# Patient Record
Sex: Female | Born: 1980 | Race: White | Hispanic: No | Marital: Married | Smoking: Never smoker
Health system: Southern US, Community
[De-identification: ages and names within clinical notes are randomized; demographics above are authoritative.]

## PROBLEM LIST (undated history)

## (undated) DIAGNOSIS — E039 Hypothyroidism, unspecified: Secondary | ICD-10-CM

## (undated) DIAGNOSIS — A609 Anogenital herpesviral infection, unspecified: Secondary | ICD-10-CM

## (undated) DIAGNOSIS — Z8619 Personal history of other infectious and parasitic diseases: Secondary | ICD-10-CM

## (undated) HISTORY — DX: Anogenital herpesviral infection, unspecified: A60.9

## (undated) HISTORY — DX: Hypothyroidism, unspecified: E03.9

## (undated) HISTORY — DX: Personal history of other infectious and parasitic diseases: Z86.19

---

## 2002-09-29 ENCOUNTER — Other Ambulatory Visit: Admission: RE | Admit: 2002-09-29 | Discharge: 2002-09-29 | Payer: Self-pay | Admitting: Obstetrics and Gynecology

## 2004-02-05 ENCOUNTER — Other Ambulatory Visit: Admission: RE | Admit: 2004-02-05 | Discharge: 2004-02-05 | Payer: Self-pay | Admitting: Obstetrics and Gynecology

## 2005-04-29 ENCOUNTER — Other Ambulatory Visit: Admission: RE | Admit: 2005-04-29 | Discharge: 2005-04-29 | Payer: Self-pay | Admitting: Obstetrics & Gynecology

## 2007-03-04 HISTORY — PX: CHOLECYSTECTOMY: SHX55

## 2007-03-24 ENCOUNTER — Emergency Department (HOSPITAL_COMMUNITY): Admission: EM | Admit: 2007-03-24 | Discharge: 2007-03-24 | Payer: Self-pay | Admitting: Emergency Medicine

## 2007-04-30 ENCOUNTER — Encounter (INDEPENDENT_AMBULATORY_CARE_PROVIDER_SITE_OTHER): Payer: Self-pay | Admitting: Surgery

## 2007-04-30 ENCOUNTER — Ambulatory Visit (HOSPITAL_COMMUNITY): Admission: RE | Admit: 2007-04-30 | Discharge: 2007-04-30 | Payer: Self-pay | Admitting: Surgery

## 2008-12-04 IMAGING — US US ABDOMEN COMPLETE
1 series · 13 of 25 positions shown · non-contrast
Comparison: None.

CLINICAL DATA: 26 year-old with abdominal pain.
 ABDOMEN ULTRASOUND:
TECHNIQUE: Complete abdominal ultrasound examination was performed including evaluation of the liver, gallbladder, bile ducts, pancreas, kidneys, spleen, IVC, and abdominal aorta.

[Series 1: unknown · 0.33mm/px · 13 of 50 slices shown]
[im 1/50]
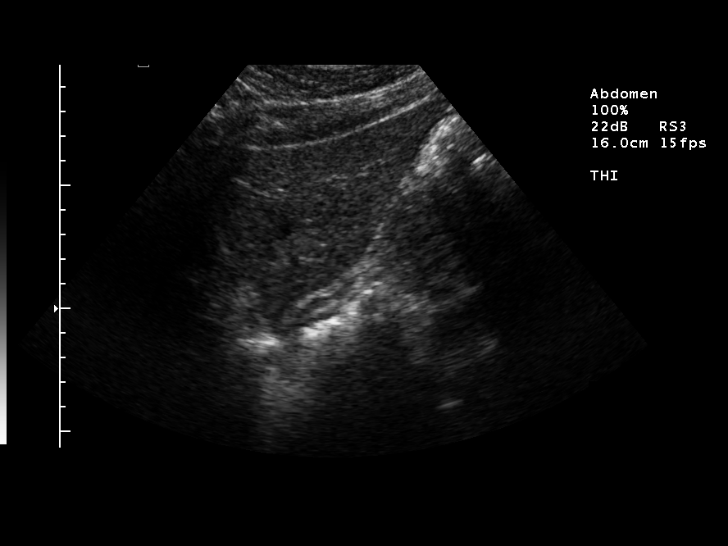
[im 5/50]
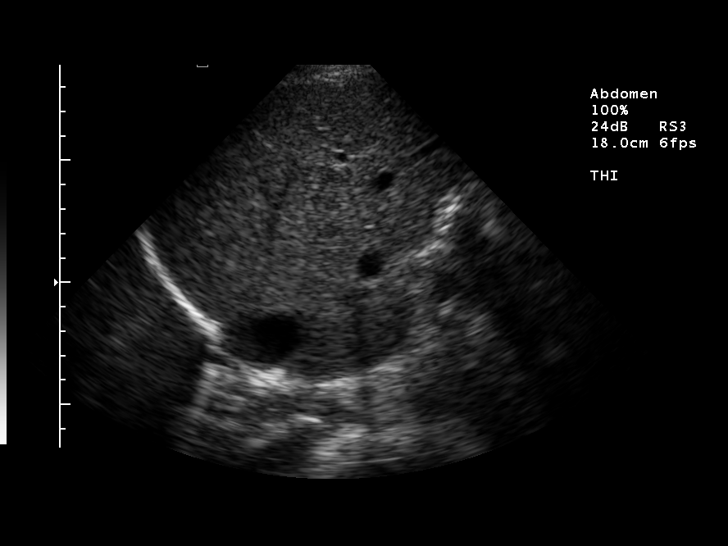
[im 9/50]
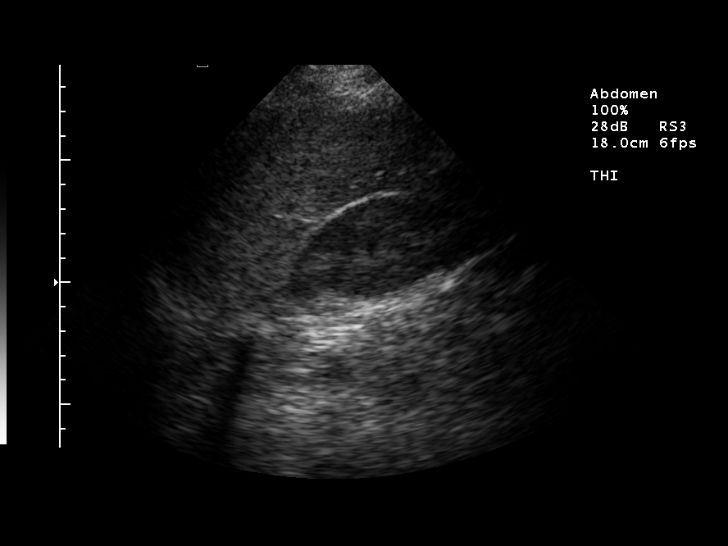
[im 13/50]
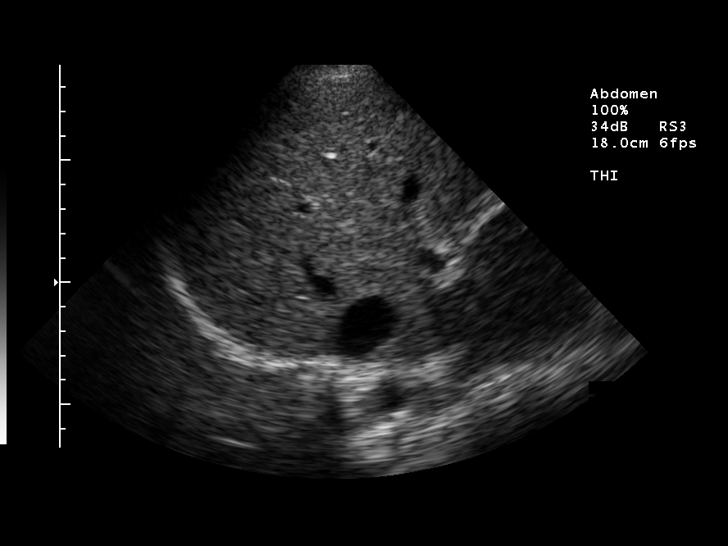
[im 17/50]
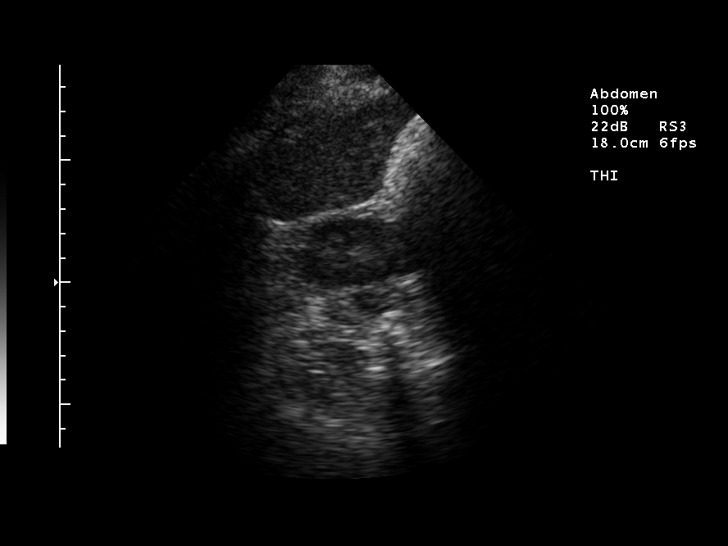
[im 21/50]
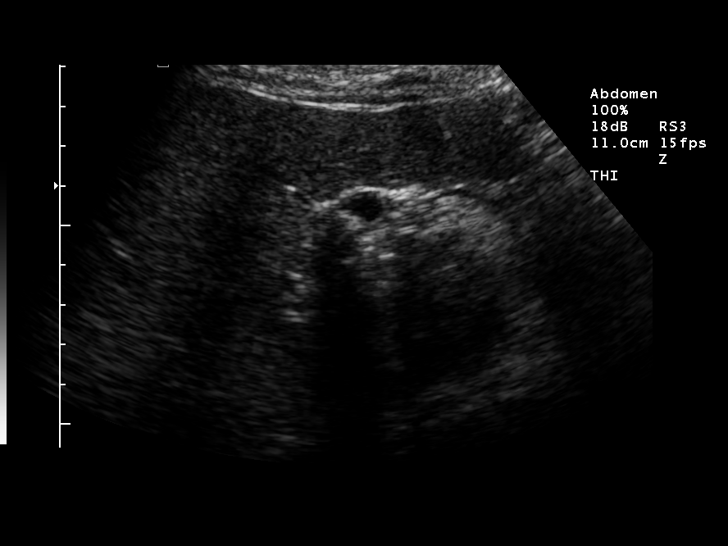
[im 25/50]
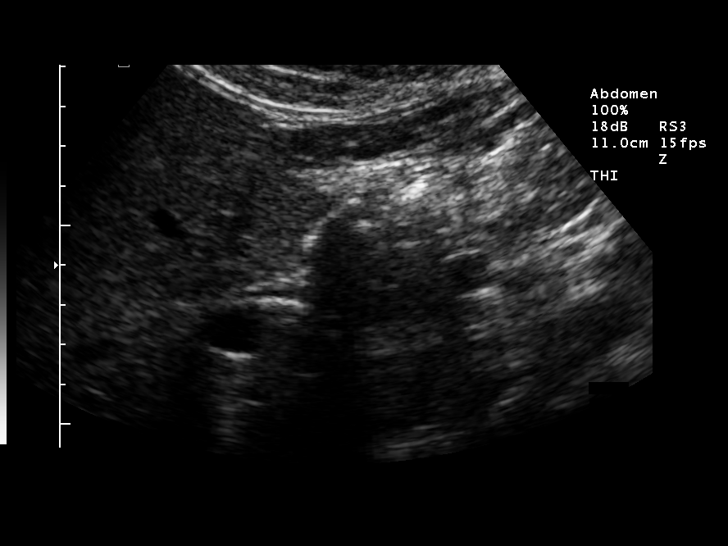
[im 29/50]
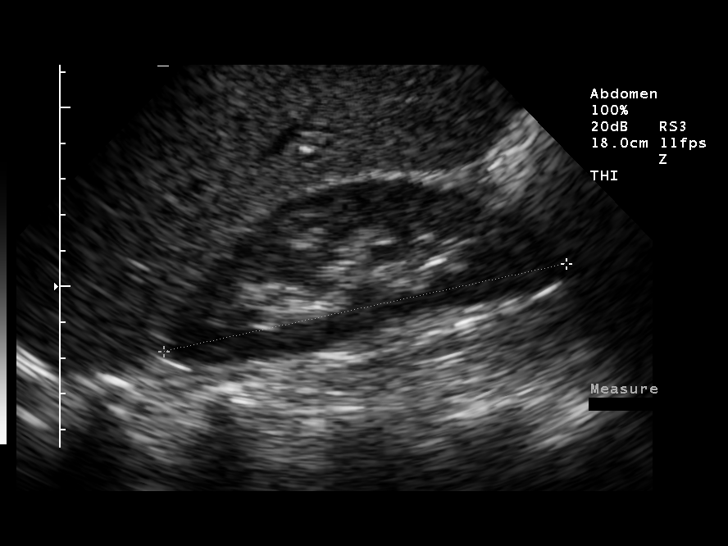
[im 33/50]
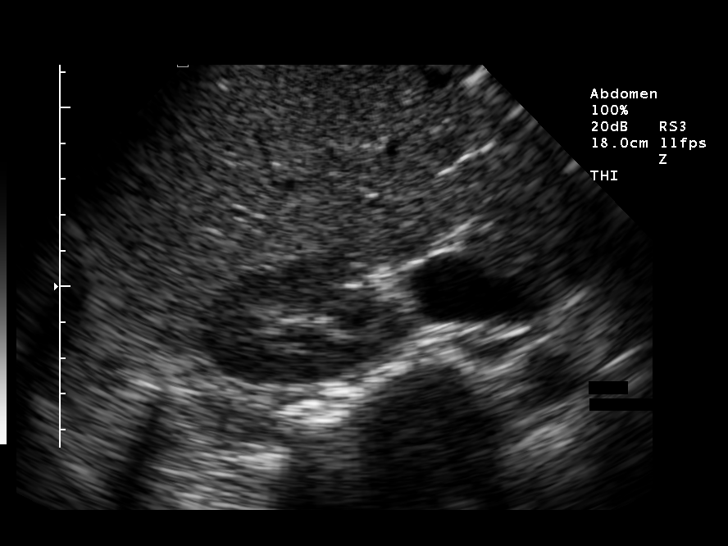
[im 37/50]
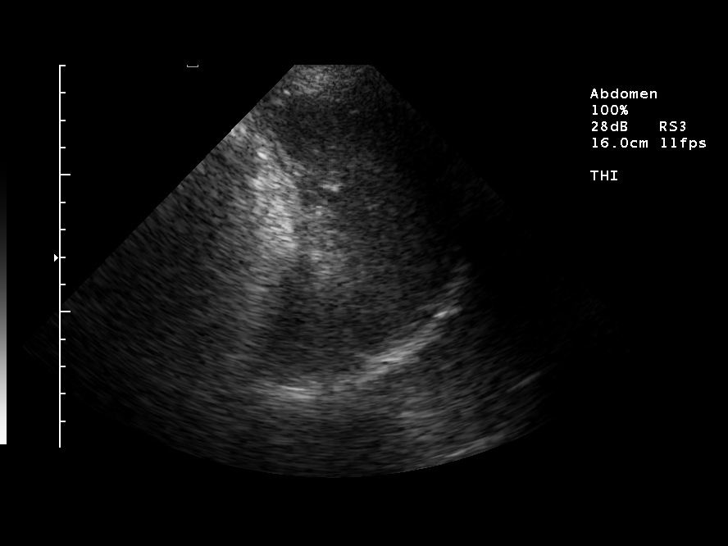
[im 41/50]
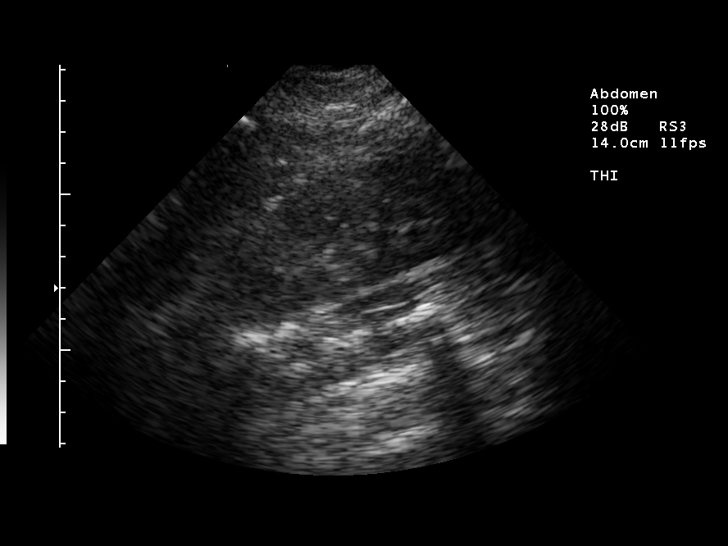
[im 45/50]
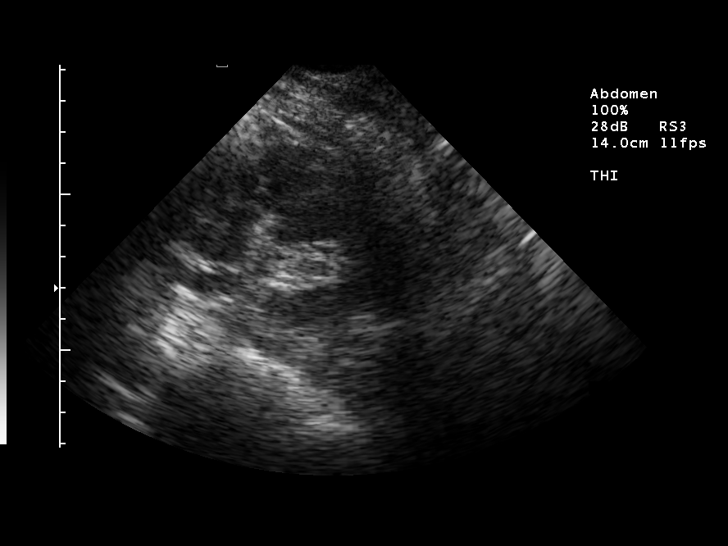
[im 50/50]
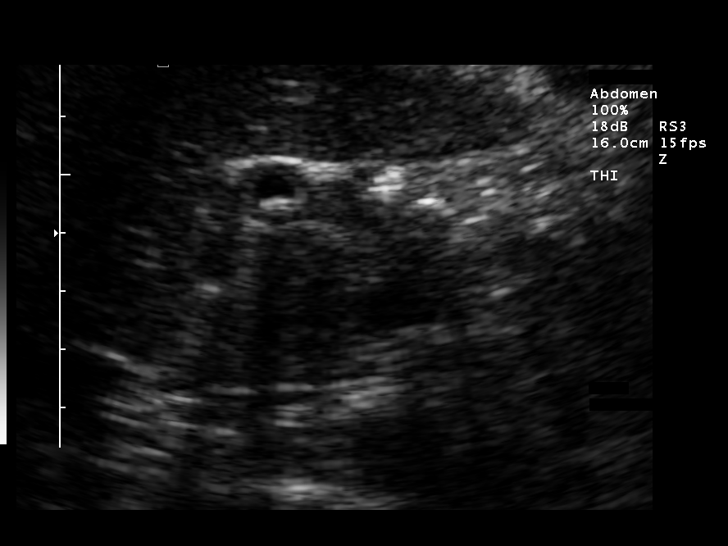

[13 of 25 positions shown; findings below may reference images not displayed]

FINDINGS: The liver demonstrates normal echogenicity. No focal lesions or intrahepatic biliary dilatation. The common bile duct measures 3.2 mm which is normal.
 The gallbladder is contracted.  Layering echogenic gallstones with acoustic shadowing.  Mild wall thickening may be due to contraction or inflammation.  No pericholecystic fluid.
 The pancreas is well visualized and demonstrates no abnormalities.  The IVC and aorta are normal in caliber.
 The spleen measures 10.6 cm.  Normal echogenicity without focal lesions.
 Right kidney measures 11.5 cm. Left kidney measures 12.2 cm.  Both kidneys demonstrate normal renal cortical thickness and echogenicity without hydronephrosis. No perinephric fluid collections are seen.
IMPRESSION: 1.  Contracted gallbladder with mild apparent wall thickening. There are layering echogenic gallstones.  No pericholecystic fluid.
 2.  Normal caliber common bile duct.

## 2010-07-16 NOTE — Op Note (Signed)
NAMECleora Nicholson             ACCOUNT NO.:  [id number]   MEDICAL RECORD NO.:  [id number]          PATIENT TYPE:  AMB   LOCATION:  DAY                          FACILITY:  [facility]   PHYSICIAN:  Wilmon Arms. Corliss Skains, M.D. DATE OF BIRTH:  [birth year]   DATE OF PROCEDURE:  04/30/2007  DATE OF DISCHARGE:                               OPERATIVE REPORT   PREOPERATIVE DIAGNOSIS:  Chronic calculus cholecystitis.   POSTOPERATIVE DIAGNOSIS:  Chronic calculus cholecystitis.   PROCEDURE:  Laparoscopic cholecystectomy with intraoperative  cholangiogram.   SURGEON:  Wilmon Arms. Tsuei, M.D., FACS   ANESTHESIA:  General endotracheal.   INDICATIONS:  The patient is a 30 year old female who presents with a 4-  month history of intermittent epigastric and right upper quadrant pain  radiating around to her back associated nausea, vomiting and belching.  This seems to be exacerbated by pizza or other types of greasy food.  She had one ER visit in January for severe episodes.  An ultrasound then  showed a contracted gallbladder with some mild wall thickening and  layering echogenic gallstones.  Her liver function tests have been  normal.  She was evaluated in the office and we recommended an elective  cholecystectomy.   DESCRIPTION OF PROCEDURE:  The patient was brought to the operating room  and placed in a supine position on the operating table.  After an  adequate level of general anesthesia was obtained, the patient's abdomen  was prepped with Betadine and draped in sterile fashion.  A time-out was  taken to ensure the proper patient and proper procedure.  The area  around her umbilicus was infiltrated with 0.25% Marcaine.  I made a  transverse incision just below her umbilicus.  Dissection was carried  down to the fascia.  The fascia was grasped with Kocher clamps and  opened vertically.  A stay suture of Vicryl was placed around the  fascial opening.  Pneumoperitoneum was obtained by inserting a  Hasson  cannula and securing it with the stay suture.  CO2 was insufflated,  maintaining a maximum pressure of 15 mmHg.  The patient was positioned  in reverse Trendelenburg and tilted to her left.  A laparoscope was  inserted and we examined the abdomen.  No gross abnormalities were seen  in the liver, stomach or the anterior surface of the omentum and  intestines.  An 11-mm port was placed in the subxiphoid position.  Two 5-  mm ports were placed in the right upper quadrant.  The gallbladder was  grasped with a clamp and elevated over the edge of the liver.  The  gallbladder did not seem to be inflamed and had no obvious scarring.  We  dissected around the hilum of the gallbladder.  We dissected  circumferentially around the cystic duct.  The cystic duct was ligated  with clips and a small opening was created on the cystic duct.  A Cook  cholangiogram catheter was inserted through a stab incision and threaded  into the cystic duct.  A cholangiogram was obtained which showed good  flow proximally and distally in the biliary tree with  no sign of  obstruction or filling defect.  The catheter was removed and the cystic  duct was ligated with clips and divided.  The cystic artery was ligated  with clips and divided.  Cautery was then used to remove the gallbladder  from the liver bed.  The gallbladder was placed in an EndoCatch sac.  We  reinspected the gallbladder fossa for hemostasis.  The right upper  quadrant was irrigated and suctioned dry.  The gallbladder and EndoCatch  sac were then removed through the umbilical port site.  The stay suture  was used to close the fascia at  the umbilical port site.  Pneumoperitoneum was then released as all the  trocars were removed.  A 4-0 Monocryl was used to close the skin  incisions.  Steri-Strips and clean dressings were applied.  The patient  was then extubated and brought to the recovery room in stable condition.  All sponge, instrument and  needle counts were correct.      Wilmon Arms. Tsuei, M.D.  Electronically Signed     MKT/MEDQ  D:  04/30/2007  T:  05/01/2007  Job:  [id number]   cc:   Caryn Bee L. Little, M.D.  Fax: [phone]

## 2010-11-22 LAB — URINE MICROSCOPIC-ADD ON

## 2010-11-22 LAB — CBC
Hemoglobin: 11.6 — ABNORMAL LOW
MCHC: 34
MCHC: 34
MCV: 85.4
Platelets: 342
RBC: 4.01
RDW: 13.2
RDW: 13.9

## 2010-11-22 LAB — DIFFERENTIAL
Basophils Absolute: 0
Basophils Relative: 0
Eosinophils Absolute: 0.1
Lymphocytes Relative: 32
Lymphocytes Relative: 29
Lymphs Abs: 2.1
Monocytes Absolute: 0.5
Monocytes Relative: 7
Neutro Abs: 5.4
Neutro Abs: 4.6
Neutrophils Relative %: 62

## 2010-11-22 LAB — URINALYSIS, ROUTINE W REFLEX MICROSCOPIC
Glucose, UA: NEGATIVE
Leukocytes, UA: NEGATIVE
Nitrite: NEGATIVE
Specific Gravity, Urine: 1.037 — ABNORMAL HIGH
pH: 6.5

## 2010-11-22 LAB — COMPREHENSIVE METABOLIC PANEL
ALT: 16
AST: 21
Albumin: 3.3 — ABNORMAL LOW
Albumin: 3.3 — ABNORMAL LOW
BUN: 9
CO2: 27
Calcium: 8.7
Calcium: 9
Chloride: 104
Creatinine, Ser: 0.8
Creatinine, Ser: 0.66
GFR calc non Af Amer: >60
Glucose, Bld: 78
Sodium: 136
Total Protein: 6.1

## 2010-11-22 LAB — PREGNANCY, URINE

## 2013-04-13 ENCOUNTER — Other Ambulatory Visit (HOSPITAL_COMMUNITY): Payer: Self-pay | Admitting: Obstetrics and Gynecology

## 2013-04-13 DIAGNOSIS — IMO0002 Reserved for concepts with insufficient information to code with codable children: Secondary | ICD-10-CM

## 2013-04-18 ENCOUNTER — Ambulatory Visit (HOSPITAL_COMMUNITY)
Admission: RE | Admit: 2013-04-18 | Discharge: 2013-04-18 | Disposition: A | Discharge status: Home / Self Care | Payer: BC Managed Care – PPO | Source: Ambulatory Visit | Attending: Obstetrics and Gynecology | Admitting: Obstetrics and Gynecology

## 2013-04-18 DIAGNOSIS — IMO0002 Reserved for concepts with insufficient information to code with codable children: Secondary | ICD-10-CM

## 2013-04-18 DIAGNOSIS — N979 Female infertility, unspecified: Secondary | ICD-10-CM | POA: Insufficient documentation

## 2013-04-18 MED ORDER — IOHEXOL 300 MG/ML  SOLN
20.0000 mL | Freq: Once | INTRAMUSCULAR | Status: AC | PRN
  Administered 2013-04-18: 11 mL

## 2013-04-19 ENCOUNTER — Ambulatory Visit (HOSPITAL_COMMUNITY): Admission: RE | Admit: 2013-04-19 | Payer: BC Managed Care – PPO | Source: Ambulatory Visit

## 2013-12-14 LAB — OB RESULTS CONSOLE ANTIBODY SCREEN: Antibody Screen: NEGATIVE

## 2013-12-14 LAB — OB RESULTS CONSOLE ABO/RH: RH Type: POSITIVE

## 2013-12-14 LAB — OB RESULTS CONSOLE HIV ANTIBODY (ROUTINE TESTING): HIV: NONREACTIVE

## 2013-12-14 LAB — OB RESULTS CONSOLE GC/CHLAMYDIA
Chlamydia: NEGATIVE
GC PROBE AMP, GENITAL: NEGATIVE

## 2013-12-14 LAB — OB RESULTS CONSOLE RPR: RPR: NONREACTIVE

## 2013-12-14 LAB — OB RESULTS CONSOLE RUBELLA ANTIBODY, IGM: Rubella: IMMUNE

## 2013-12-14 LAB — OB RESULTS CONSOLE HEPATITIS B SURFACE ANTIGEN: HEP B S AG: NEGATIVE

## 2014-03-22 ENCOUNTER — Inpatient Hospital Stay (HOSPITAL_COMMUNITY): Admission: AD | Admit: 2014-03-22 | Payer: Self-pay | Source: Ambulatory Visit | Admitting: Obstetrics and Gynecology

## 2014-05-25 LAB — OB RESULTS CONSOLE GBS: STREP GROUP B AG: POSITIVE

## 2014-06-28 ENCOUNTER — Encounter (HOSPITAL_COMMUNITY): Payer: Self-pay | Admitting: *Deleted

## 2014-06-28 ENCOUNTER — Telehealth (HOSPITAL_COMMUNITY): Payer: Self-pay | Admitting: *Deleted

## 2014-06-28 NOTE — Telephone Encounter (Signed)
Preadmission screen  

## 2014-07-03 ENCOUNTER — Encounter (HOSPITAL_COMMUNITY): Payer: Self-pay

## 2014-07-03 ENCOUNTER — Inpatient Hospital Stay (HOSPITAL_COMMUNITY)
Admission: RE | Admit: 2014-07-03 | Discharge: 2014-07-06 | DRG: 765 | Disposition: A | Discharge status: Home / Self Care | Payer: BC Managed Care – PPO | Source: Ambulatory Visit | Attending: Obstetrics and Gynecology | Admitting: Obstetrics and Gynecology

## 2014-07-03 VITALS — BP 115/61 | HR 72 | Temp 98.1°F | Resp 18 | Ht 70.5 in | Wt 271.0 lb

## 2014-07-03 DIAGNOSIS — O99824 Streptococcus B carrier state complicating childbirth: Secondary | ICD-10-CM | POA: Diagnosis present

## 2014-07-03 DIAGNOSIS — O9832 Other infections with a predominantly sexual mode of transmission complicating childbirth: Secondary | ICD-10-CM | POA: Diagnosis present

## 2014-07-03 DIAGNOSIS — Z9049 Acquired absence of other specified parts of digestive tract: Secondary | ICD-10-CM | POA: Diagnosis present

## 2014-07-03 DIAGNOSIS — Z3A40 40 weeks gestation of pregnancy: Secondary | ICD-10-CM | POA: Diagnosis present

## 2014-07-03 DIAGNOSIS — Z23 Encounter for immunization: Secondary | ICD-10-CM | POA: Diagnosis not present

## 2014-07-03 DIAGNOSIS — Z9889 Other specified postprocedural states: Secondary | ICD-10-CM

## 2014-07-03 DIAGNOSIS — E039 Hypothyroidism, unspecified: Secondary | ICD-10-CM | POA: Diagnosis present

## 2014-07-03 DIAGNOSIS — O99284 Endocrine, nutritional and metabolic diseases complicating childbirth: Secondary | ICD-10-CM | POA: Diagnosis present

## 2014-07-03 DIAGNOSIS — Z79899 Other long term (current) drug therapy: Secondary | ICD-10-CM

## 2014-07-03 DIAGNOSIS — O480 Post-term pregnancy: Secondary | ICD-10-CM | POA: Diagnosis present

## 2014-07-03 DIAGNOSIS — A6000 Herpesviral infection of urogenital system, unspecified: Secondary | ICD-10-CM | POA: Diagnosis present

## 2014-07-03 LAB — CBC
HCT: 33.3 % — ABNORMAL LOW (ref 36.0–46.0)
Hemoglobin: 11.1 g/dL — ABNORMAL LOW (ref 12.0–15.0)
MCH: 30.5 pg (ref 26.0–34.0)
MCHC: 33.3 g/dL (ref 30.0–36.0)
MCV: 91.5 fL (ref 78.0–100.0)
PLATELETS: 215 10*3/uL (ref 150–400)
RBC: 3.64 MIL/uL — ABNORMAL LOW (ref 3.87–5.11)
RDW: 14.9 % (ref 11.5–15.5)
WBC: 10.6 10*3/uL — ABNORMAL HIGH (ref 4.0–10.5)

## 2014-07-03 MED ORDER — LACTATED RINGERS IV SOLN
500.0000 mL | INTRAVENOUS | Status: DC | PRN
  Administered 2014-07-04 (×2): 500 mL via INTRAVENOUS

## 2014-07-03 MED ORDER — ONDANSETRON HCL 4 MG/2ML IJ SOLN: 4.0000 mg | Freq: Four times a day (QID) | INTRAMUSCULAR | Status: DC | PRN

## 2014-07-03 MED ORDER — OXYTOCIN 40 UNITS IN LACTATED RINGERS INFUSION - SIMPLE MED: 62.5000 mL/h | INTRAVENOUS | Status: DC

## 2014-07-03 MED ORDER — CITRIC ACID-SODIUM CITRATE 334-500 MG/5ML PO SOLN
30.0000 mL | ORAL | Status: DC | PRN
  Administered 2014-07-04: 30 mL via ORAL
  Filled 2014-07-03: qty 15

## 2014-07-03 MED ORDER — MISOPROSTOL 25 MCG QUARTER TABLET
25.0000 ug | ORAL_TABLET | ORAL | Status: DC | PRN
  Administered 2014-07-03 – 2014-07-04 (×2): 25 ug via VAGINAL
  Filled 2014-07-03 (×2): qty 0.25

## 2014-07-03 MED ORDER — ACETAMINOPHEN 325 MG PO TABS: 650.0000 mg | ORAL_TABLET | ORAL | Status: DC | PRN

## 2014-07-03 MED ORDER — LIDOCAINE HCL (PF) 1 % IJ SOLN: 30.0000 mL | INTRAMUSCULAR | Status: DC | PRN

## 2014-07-03 MED ORDER — LACTATED RINGERS IV SOLN
INTRAVENOUS | Status: DC
  Administered 2014-07-03 – 2014-07-04 (×2): via INTRAVENOUS

## 2014-07-03 MED ORDER — TERBUTALINE SULFATE 1 MG/ML IJ SOLN: 0.2500 mg | Freq: Once | INTRAMUSCULAR | Status: AC | PRN

## 2014-07-03 MED ORDER — BUTORPHANOL TARTRATE 1 MG/ML IJ SOLN
1.0000 mg | INTRAMUSCULAR | Status: DC | PRN
Start: 2014-07-03 — End: 2014-07-04

## 2014-07-03 MED ORDER — ZOLPIDEM TARTRATE 5 MG PO TABS: 5.0000 mg | ORAL_TABLET | Freq: Every evening | ORAL | Status: DC | PRN

## 2014-07-03 MED ORDER — OXYTOCIN BOLUS FROM INFUSION: 500.0000 mL | INTRAVENOUS | Status: DC

## 2014-07-03 MED ORDER — PENICILLIN G POTASSIUM 5000000 UNITS IJ SOLR
2.5000 10*6.[IU] | INTRAVENOUS | Status: DC
  Administered 2014-07-04 (×2): 2.5 10*6.[IU] via INTRAVENOUS
  Filled 2014-07-03 (×5): qty 2.5

## 2014-07-03 MED ORDER — PENICILLIN G POTASSIUM 5000000 UNITS IJ SOLR
5.0000 10*6.[IU] | Freq: Once | INTRAMUSCULAR | Status: AC
  Administered 2014-07-04: 5 10*6.[IU] via INTRAVENOUS
  Filled 2014-07-03: qty 5

## 2014-07-03 NOTE — Plan of Care (Signed)
Problem: Consults Goal: Birthing Suites Patient Information Press F2 to bring up selections list Outcome: Completed/Met Date Met:  07/03/14  Pt > [redacted] weeks EGA and Inpatient induction

## 2014-07-03 NOTE — H&P (Signed)
Patricia Nicholson is a 34 y.o. female G1P0 at 40 weeks 6 days presenting for induction of labor d/t post dates. Patient with irregular ctx, no vaginal bleeding, no leaking of fluid, notes good fetal movement.   Maternal Medical History:  Reason for admission: Nausea.  Contractions: Onset was 1-2 hours ago.   Frequency: irregular.   Duration is approximately 60 seconds.   Perceived severity is mild.    Fetal activity: Perceived fetal activity is normal.   Last perceived fetal movement was within the past 12 hours.    Prenatal complications: no prenatal complications Prenatal Complications - Diabetes: none.    OB History    Gravida Para Term Preterm AB TAB SAB Ectopic Multiple Living   1              Past Medical History  Diagnosis Date  . HSV (herpes simplex virus) anogenital infection   . Hypothyroidism   . Hx of varicella    Past Surgical History  Procedure Laterality Date  . Cholecystectomy  2009   Family History: family history includes Atrial fibrillation in her father; Diabetes in her mother; Hypertension in her father. Social History:  reports that she has never smoked. She has never used smokeless tobacco. She reports that she does not drink alcohol or use illicit drugs.   Prenatal Transfer Tool  Maternal Diabetes: No Genetic Screening: Normal Maternal Ultrasounds/Referrals: Normal Fetal Ultrasounds or other Referrals:  Other:  Normal  Maternal Substance Abuse:  No Significant Maternal Medications:  Meds include: Syntroid Significant Maternal Lab Results:  Lab values include: Group B Strep positive Other Comments:  None  Review of Systems  Constitutional: Negative for fever and chills.  HENT: Negative for hearing loss.   Eyes: Negative for blurred vision and double vision.  Respiratory: Negative for cough.   Cardiovascular: Negative for chest pain and palpitations.  Gastrointestinal: Negative.  Negative for heartburn and nausea.  Genitourinary: Negative for  dysuria and urgency.  Skin: Negative for itching and rash.  Neurological: Negative for dizziness, tingling and headaches.  All other systems reviewed and are negative.     Last menstrual period 09/20/2013. Maternal Exam:  Uterine Assessment: Contraction strength is mild.  Contraction duration is 45 seconds. Contraction frequency is irregular.   Abdomen: Patient reports no abdominal tenderness. Fundal height is 40 cm.   Estimated fetal weight is 3200 grams.   Fetal presentation: vertex  Introitus: Normal vulva. Vulva is negative for lesion.  Normal vagina.  Vagina is negative for ulcerations.  Ferning test: not done.  Nitrazine test: not done. Amniotic fluid character: not assessed.  Pelvis: adequate for delivery.   Cervix: Cervix evaluated by digital exam.   Last cervical exam in office: closed / long / post  Fetal Exam Fetal Monitor Review: Mode: hand-held doppler probe.   Baseline rate: 140.  Variability: moderate (6-25 bpm).   Pattern: no decelerations and accelerations present.    Fetal State Assessment: Category I - tracings are normal.     Physical Exam  Vitals reviewed. Constitutional: She is oriented to person, place, and time. She appears well-developed and well-nourished.  HENT:  Head: Normocephalic.  Eyes: Pupils are equal, round, and reactive to light.  Neck: Normal range of motion.  Respiratory: Effort normal.  GI: Soft.  Genitourinary: Vulva exhibits no lesion.  Neurological: She is alert and oriented to person, place, and time. She has normal reflexes.  Skin: Skin is warm.  Psychiatric: She has a normal mood and affect.  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  Synthroid for hypothyroidism GBS positive - PCN for prophylaxis Misoprostol for cervical ripening, then pitocin for augmentation Epidural on demand   Patricia Nicholson 07/03/2014, 7:57 PM

## 2014-07-04 ENCOUNTER — Inpatient Hospital Stay (HOSPITAL_COMMUNITY): Payer: BC Managed Care – PPO | Admitting: Anesthesiology

## 2014-07-04 ENCOUNTER — Encounter (HOSPITAL_COMMUNITY)
Admission: RE | Disposition: A | Discharge status: Home / Self Care | Payer: Self-pay | Source: Ambulatory Visit | Attending: Obstetrics and Gynecology

## 2014-07-04 ENCOUNTER — Encounter (HOSPITAL_COMMUNITY): Payer: Self-pay

## 2014-07-04 LAB — RPR: RPR: NONREACTIVE

## 2014-07-04 LAB — TYPE AND SCREEN
ABO/RH(D): O POS
Antibody Screen: NEGATIVE

## 2014-07-04 SURGERY — Surgical Case
Anesthesia: Epidural

## 2014-07-04 MED ORDER — MORPHINE SULFATE (PF) 0.5 MG/ML IJ SOLN
INTRAMUSCULAR | Status: DC | PRN
  Administered 2014-07-04: 4 mg via EPIDURAL

## 2014-07-04 MED ORDER — MEPERIDINE HCL 25 MG/ML IJ SOLN
INTRAMUSCULAR | Status: DC | PRN
  Administered 2014-07-04 (×2): 12.5 mg via INTRAVENOUS

## 2014-07-04 MED ORDER — KETOROLAC TROMETHAMINE 30 MG/ML IJ SOLN
30.0000 mg | Freq: Four times a day (QID) | INTRAMUSCULAR | Status: AC | PRN
  Administered 2014-07-04: 30 mg via INTRAMUSCULAR

## 2014-07-04 MED ORDER — OXYTOCIN 40 UNITS IN LACTATED RINGERS INFUSION - SIMPLE MED
1.0000 m[IU]/min | INTRAVENOUS | Status: DC
  Administered 2014-07-04: 6 m[IU]/min via INTRAVENOUS
  Administered 2014-07-04: 2 m[IU]/min via INTRAVENOUS
  Filled 2014-07-04: qty 1000

## 2014-07-04 MED ORDER — NALBUPHINE HCL 10 MG/ML IJ SOLN: 5.0000 mg | Freq: Once | INTRAMUSCULAR | Status: AC | PRN

## 2014-07-04 MED ORDER — SODIUM BICARBONATE 8.4 % IV SOLN
INTRAVENOUS | Status: DC | PRN
  Administered 2014-07-04 (×2): 5 mL via EPIDURAL

## 2014-07-04 MED ORDER — KETOROLAC TROMETHAMINE 30 MG/ML IJ SOLN: 30.0000 mg | Freq: Four times a day (QID) | INTRAMUSCULAR | Status: AC | PRN

## 2014-07-04 MED ORDER — LACTATED RINGERS IV SOLN
INTRAVENOUS | Status: DC | PRN
  Administered 2014-07-04 (×2): via INTRAVENOUS

## 2014-07-04 MED ORDER — CEFAZOLIN SODIUM-DEXTROSE 2-3 GM-% IV SOLR
INTRAVENOUS | Status: AC
  Filled 2014-07-04: qty 50

## 2014-07-04 MED ORDER — ONDANSETRON HCL 4 MG/2ML IJ SOLN
INTRAMUSCULAR | Status: DC | PRN
  Administered 2014-07-04: 4 mg via INTRAVENOUS

## 2014-07-04 MED ORDER — KETOROLAC TROMETHAMINE 30 MG/ML IJ SOLN
INTRAMUSCULAR | Status: AC
  Administered 2014-07-04: 30 mg via INTRAMUSCULAR
  Filled 2014-07-04: qty 1

## 2014-07-04 MED ORDER — ONDANSETRON HCL 4 MG/2ML IJ SOLN
INTRAMUSCULAR | Status: AC
  Filled 2014-07-04: qty 2

## 2014-07-04 MED ORDER — DIPHENHYDRAMINE HCL 50 MG/ML IJ SOLN: 12.5000 mg | INTRAMUSCULAR | Status: DC | PRN

## 2014-07-04 MED ORDER — CEFAZOLIN SODIUM-DEXTROSE 2-3 GM-% IV SOLR
INTRAVENOUS | Status: DC | PRN
  Administered 2014-07-04: 2 g via INTRAVENOUS

## 2014-07-04 MED ORDER — LIDOCAINE HCL (PF) 1 % IJ SOLN
INTRAMUSCULAR | Status: DC | PRN
  Administered 2014-07-04: 10 mL

## 2014-07-04 MED ORDER — ONDANSETRON HCL 4 MG/2ML IJ SOLN: 4.0000 mg | Freq: Once | INTRAMUSCULAR | Status: DC | PRN

## 2014-07-04 MED ORDER — OXYTOCIN 10 UNIT/ML IJ SOLN
INTRAMUSCULAR | Status: AC
  Filled 2014-07-04: qty 4

## 2014-07-04 MED ORDER — PHENYLEPHRINE 40 MCG/ML (10ML) SYRINGE FOR IV PUSH (FOR BLOOD PRESSURE SUPPORT)
PREFILLED_SYRINGE | INTRAVENOUS | Status: AC
  Filled 2014-07-04: qty 10

## 2014-07-04 MED ORDER — EPHEDRINE 5 MG/ML INJ: 10.0000 mg | INTRAVENOUS | Status: DC | PRN

## 2014-07-04 MED ORDER — LACTATED RINGERS IV SOLN
INTRAVENOUS | Status: DC | PRN
  Administered 2014-07-04: 21:00:00 via INTRAVENOUS

## 2014-07-04 MED ORDER — MORPHINE SULFATE 0.5 MG/ML IJ SOLN
INTRAMUSCULAR | Status: AC
  Filled 2014-07-04: qty 10

## 2014-07-04 MED ORDER — OXYTOCIN 10 UNIT/ML IJ SOLN
40.0000 [IU] | INTRAVENOUS | Status: DC | PRN
  Administered 2014-07-04: 40 [IU] via INTRAVENOUS

## 2014-07-04 MED ORDER — MEPERIDINE HCL 25 MG/ML IJ SOLN: 6.2500 mg | INTRAMUSCULAR | Status: DC | PRN

## 2014-07-04 MED ORDER — SCOPOLAMINE 1 MG/3DAYS TD PT72
MEDICATED_PATCH | TRANSDERMAL | Status: DC | PRN
  Administered 2014-07-04: 1 via TRANSDERMAL

## 2014-07-04 MED ORDER — FENTANYL 2.5 MCG/ML BUPIVACAINE 1/10 % EPIDURAL INFUSION (WH - ANES): 14.0000 mL/h | INTRAMUSCULAR | Status: DC | PRN

## 2014-07-04 MED ORDER — FENTANYL 2.5 MCG/ML BUPIVACAINE 1/10 % EPIDURAL INFUSION (WH - ANES)
14.0000 mL/h | INTRAMUSCULAR | Status: DC | PRN
  Administered 2014-07-04: 14 mL/h via EPIDURAL
  Filled 2014-07-04: qty 125

## 2014-07-04 MED ORDER — FENTANYL CITRATE (PF) 100 MCG/2ML IJ SOLN: 25.0000 ug | INTRAMUSCULAR | Status: DC | PRN

## 2014-07-04 MED ORDER — SCOPOLAMINE 1 MG/3DAYS TD PT72
MEDICATED_PATCH | TRANSDERMAL | Status: AC
  Filled 2014-07-04: qty 1

## 2014-07-04 MED ORDER — MEPERIDINE HCL 25 MG/ML IJ SOLN
INTRAMUSCULAR | Status: AC
  Filled 2014-07-04: qty 1

## 2014-07-04 MED ORDER — PHENYLEPHRINE 40 MCG/ML (10ML) SYRINGE FOR IV PUSH (FOR BLOOD PRESSURE SUPPORT)
80.0000 ug | PREFILLED_SYRINGE | INTRAVENOUS | Status: DC | PRN
Start: 2014-07-04 — End: 2014-07-04
  Filled 2014-07-04: qty 20

## 2014-07-04 SURGICAL SUPPLY — 34 items
APL SKNCLS STERI-STRIP NONHPOA (GAUZE/BANDAGES/DRESSINGS) ×1
BENZOIN TINCTURE PRP APPL 2/3 (GAUZE/BANDAGES/DRESSINGS) ×3 IMPLANT
CLAMP CORD UMBIL (MISCELLANEOUS) IMPLANT
CLOSURE STERI STRIP 1/2 X4 (GAUZE/BANDAGES/DRESSINGS) ×1 IMPLANT
CLOSURE WOUND 1/2 X4 (GAUZE/BANDAGES/DRESSINGS) ×1
CLOTH BEACON ORANGE TIMEOUT ST (SAFETY) ×3 IMPLANT
DRAPE SHEET LG 3/4 BI-LAMINATE (DRAPES) IMPLANT
DRSG OPSITE POSTOP 4X10 (GAUZE/BANDAGES/DRESSINGS) ×3 IMPLANT
DURAPREP 26ML APPLICATOR (WOUND CARE) ×3 IMPLANT
ELECT REM PT RETURN 9FT ADLT (ELECTROSURGICAL) ×3
ELECTRODE REM PT RTRN 9FT ADLT (ELECTROSURGICAL) ×1 IMPLANT
EXTRACTOR VACUUM BELL STYLE (SUCTIONS) IMPLANT
GLOVE BIO SURGEON STRL SZ7 (GLOVE) ×3 IMPLANT
GOWN STRL REUS W/TWL LRG LVL3 (GOWN DISPOSABLE) ×6 IMPLANT
KIT ABG SYR 3ML LUER SLIP (SYRINGE) IMPLANT
NDL HYPO 25X5/8 SAFETYGLIDE (NEEDLE) IMPLANT
NEEDLE HYPO 25X5/8 SAFETYGLIDE (NEEDLE) IMPLANT
NS IRRIG 1000ML POUR BTL (IV SOLUTION) ×3 IMPLANT
PACK C SECTION WH (CUSTOM PROCEDURE TRAY) ×3 IMPLANT
PAD OB MATERNITY 4.3X12.25 (PERSONAL CARE ITEMS) ×3 IMPLANT
RTRCTR C-SECT PINK 25CM LRG (MISCELLANEOUS) ×3 IMPLANT
STAPLER VISISTAT 35W (STAPLE) IMPLANT
STRIP CLOSURE SKIN 1/2X4 (GAUZE/BANDAGES/DRESSINGS) ×2 IMPLANT
SUT MNCRL 0 VIOLET CTX 36 (SUTURE) ×2 IMPLANT
SUT MONOCRYL 0 CTX 36 (SUTURE) ×4
SUT PDS AB 0 CTX 60 (SUTURE) IMPLANT
SUT PLAIN 2 0 XLH (SUTURE) IMPLANT
SUT VIC AB 0 CT1 27 (SUTURE) ×6
SUT VIC AB 0 CT1 27XBRD ANBCTR (SUTURE) ×2 IMPLANT
SUT VIC AB 2-0 CT1 27 (SUTURE) ×3
SUT VIC AB 2-0 CT1 TAPERPNT 27 (SUTURE) ×1 IMPLANT
SUT VIC AB 4-0 KS 27 (SUTURE) ×3 IMPLANT
TOWEL OR 17X24 6PK STRL BLUE (TOWEL DISPOSABLE) ×3 IMPLANT
TRAY FOLEY CATH SILVER 14FR (SET/KITS/TRAYS/PACK) ×3 IMPLANT

## 2014-07-04 NOTE — Brief Op Note (Addendum)
07/03/2014 - 07/04/2014  10:10 PM  PATIENT:  Patricia Nicholson  34 y.o. female  PRE-OPERATIVE DIAGNOSIS:  Non reassuring fetal heart tones remote from vaginal delivery  POST-OPERATIVE DIAGNOSIS:  Primary Cesearan Section  PROCEDURE:  Procedure(s): CESAREAN SECTION (N/A)  SURGEON:  Surgeon(s) and Role:    * Carrington ClampMichelle Baylin Gamblin, MD - Primary  ANESTHESIA:   epidural  EBL:  Total I/O In: 2000 [I.V.:2000] Out: 1100 [Urine:100; Blood:1000]  SPECIMEN:  No Specimen  DISPOSITION OF SPECIMEN:  N/A  COUNTS:  YES  TOURNIQUET:  * No tourniquets in log *  DICTATION: .Note written in EPIC  PLAN OF CARE: Admit to inpatient   PATIENT DISPOSITION:  PACU - hemodynamically stable.   Delay start of Pharmacological VTE agent (>24hrs) due to surgical blood loss or risk of bleeding: not applicable  Complications:  none Medications:  Ancef, Pitocin Findings:  Baby female, Apgars 9,9, weight P.   Normal tubes, ovaries and uterus seen.  Baby was skin to skin with mother after birth in the OR.  Technique:  After adequate epidural anesthesia was achieved, the patient was prepped and draped in usual sterile fashion.  A foley catheter was used to drain the bladder.  A pfannanstiel incision was made with the scalpel and carried down to the fascia with the bovie cautery. The fascia was incised in the midline with the scalpel and carried in a transverse curvilinear manner bilaterally.  The fascia was reflected superiorly and inferiorly off the rectus muscles and the muscles split in the midline.  A bowel free portion of the peritoneum was entered bluntly and then extended in a superior and inferior manner with good visualization of the bowel and bladder.  The Alexis instrument was then placed and the vesico-uterine fascia tented up and incised in a transverse curvilinear manner.  A 2 cm transverse incision was made in the upper portion of the lower uterine segment until the amnion was exposed.   The incision was  extended transversely in a blunt manner.  Clear fluid was noted and the baby delivered in the vertex presentation without complication.  The baby was bulb suctioned and the cord was clamped and cut.  The baby was then handed to awaiting Neonatology.  The placenta was then delivered manually and the uterus cleared of all debris.  The uterine incision was then closed with a running lock stitch of 0 monocryl.  An imbricating layer of 0 monocryl was closed as well. Excellent hemostasis of the uterine incision was achieved and the abdomen was cleared with irrigation.  The peritoneum was closed with a running stitch of 2-0 vicryl.  This incorporated the rectus muscles as a separate layer.  The fascia was then closed with a running stitch of 0 vicryl.  The subcutaneous layer was closed with interrupted  stitches of 2-0 plain gut.  The skin was closed with 4-0 vicryl on a Keith needle and steri-strips.  The patient tolerated the procedure well and was returned to the recovery room in stable condition.  All counts were correct times three.  Courtnay Petrilla A

## 2014-07-04 NOTE — Anesthesia Preprocedure Evaluation (Signed)
Anesthesia Evaluation  Patient identified by MRN, date of birth, ID band Patient awake    Reviewed: Allergy & Precautions, NPO status , Patient's Chart, lab work & pertinent test results  Airway Mallampati: II  TM Distance: >3 FB Neck ROM: Full    Dental no notable dental hx. (+) Dental Advisory Given   Pulmonary neg pulmonary ROS,  breath sounds clear to auscultation  Pulmonary exam normal       Cardiovascular negative cardio ROS Normal cardiovascular examRhythm:Regular Rate:Normal     Neuro/Psych negative neurological ROS  negative psych ROS   GI/Hepatic negative GI ROS, Neg liver ROS,   Endo/Other  Hypothyroidism obesity  Renal/GU negative Renal ROS  negative genitourinary   Musculoskeletal negative musculoskeletal ROS (+)   Abdominal   Peds negative pediatric ROS (+)  Hematology negative hematology ROS (+)   Anesthesia Other Findings   Reproductive/Obstetrics (+) Pregnancy C/S for nonreassuring FHR trace                             Anesthesia Physical Anesthesia Plan  ASA: III and emergent  Anesthesia Plan: Epidural   Post-op Pain Management:    Induction:   Airway Management Planned:   Additional Equipment:   Intra-op Plan:   Post-operative Plan:   Informed Consent: I have reviewed the patients History and Physical, chart, labs and discussed the procedure including the risks, benefits and alternatives for the proposed anesthesia with the patient or authorized representative who has indicated his/her understanding and acceptance.   Dental advisory given  Plan Discussed with:   Anesthesia Plan Comments:         Anesthesia Quick Evaluation

## 2014-07-04 NOTE — Anesthesia Procedure Notes (Addendum)
Epidural Patient location during procedure: OB Start time: 07/04/2014 4:04 PM End time: 07/04/2014 4:22 PM  Staffing Anesthesiologist: Sebastian AcheMANNY, Taneya Conkel Performed by: anesthesiologist   Preanesthetic Checklist Completed: patient identified, site marked, surgical consent, pre-op evaluation, timeout performed, IV checked, risks and benefits discussed and monitors and equipment checked  Epidural Patient position: sitting Prep: site prepped and draped and DuraPrep Patient monitoring: heart rate, continuous pulse ox and blood pressure Approach: midline Location: L2-L3 Injection technique: LOR air  Needle:  Needle type: Tuohy  Needle gauge: 17 G Needle length: 9 cm and 9 Needle insertion depth: 6 cm Catheter type: closed end flexible Catheter size: 19 Gauge Test dose: negative  Assessment Events: blood not aspirated, injection not painful, no injection resistance, negative IV test and no paresthesia  Additional Notes   Patient tolerated the insertion well without complications.Reason for block:procedure for pain

## 2014-07-04 NOTE — Anesthesia Preprocedure Evaluation (Signed)
Anesthesia Evaluation  Patient identified by MRN, date of birth, ID band Patient awake and Patient confused    Reviewed: Allergy & Precautions, H&P , NPO status , Patient's Chart, lab work & pertinent test results  Airway Mallampati: II       Dental   Pulmonary  breath sounds clear to auscultation  Pulmonary exam normal       Cardiovascular Exercise Tolerance: Good Normal cardiovascular examRhythm:regular Rate:Normal     Neuro/Psych    GI/Hepatic   Endo/Other  Morbid obesity  Renal/GU      Musculoskeletal   Abdominal   Peds  Hematology   Anesthesia Other Findings   Reproductive/Obstetrics (+) Pregnancy                             Anesthesia Physical Anesthesia Plan  ASA: II  Anesthesia Plan:    Post-op Pain Management:    Induction:   Airway Management Planned:   Additional Equipment:   Intra-op Plan:   Post-operative Plan:   Informed Consent: I have reviewed the patients History and Physical, chart, labs and discussed the procedure including the risks, benefits and alternatives for the proposed anesthesia with the patient or authorized representative who has indicated his/her understanding and acceptance.     Plan Discussed with:   Anesthesia Plan Comments:         Anesthesia Quick Evaluation

## 2014-07-04 NOTE — Op Note (Signed)
07/03/2014 - 07/04/2014  10:10 PM  PATIENT:  Patricia Nicholson  33 y.o. female  PRE-OPERATIVE DIAGNOSIS:  Non reassuring fetal heart tones remote from vaginal delivery  POST-OPERATIVE DIAGNOSIS:  Primary Cesearan Section  PROCEDURE:  Procedure(s): CESAREAN SECTION (N/A)  SURGEON:  Surgeon(s) and Role:    * Maricarmen Braziel, MD - Primary  ANESTHESIA:   epidural  EBL:  Total I/O In: 2000 [I.V.:2000] Out: 1100 [Urine:100; Blood:1000]  SPECIMEN:  No Specimen  DISPOSITION OF SPECIMEN:  N/A  COUNTS:  YES  TOURNIQUET:  * No tourniquets in log *  DICTATION: .Note written in EPIC  PLAN OF CARE: Admit to inpatient   PATIENT DISPOSITION:  PACU - hemodynamically stable.   Delay start of Pharmacological VTE agent (>24hrs) due to surgical blood loss or risk of bleeding: not applicable  Complications:  none Medications:  Ancef, Pitocin Findings:  Baby female, Apgars 9,9, weight P.   Normal tubes, ovaries and uterus seen.  Baby was skin to skin with mother after birth in the OR.  Technique:  After adequate epidural anesthesia was achieved, the patient was prepped and draped in usual sterile fashion.  A foley catheter was used to drain the bladder.  A pfannanstiel incision was made with the scalpel and carried down to the fascia with the bovie cautery. The fascia was incised in the midline with the scalpel and carried in a transverse curvilinear manner bilaterally.  The fascia was reflected superiorly and inferiorly off the rectus muscles and the muscles split in the midline.  A bowel free portion of the peritoneum was entered bluntly and then extended in a superior and inferior manner with good visualization of the bowel and bladder.  The Alexis instrument was then placed and the vesico-uterine fascia tented up and incised in a transverse curvilinear manner.  A 2 cm transverse incision was made in the upper portion of the lower uterine segment until the amnion was exposed.   The incision was  extended transversely in a blunt manner.  Clear fluid was noted and the baby delivered in the vertex presentation without complication.  The baby was bulb suctioned and the cord was clamped and cut.  The baby was then handed to awaiting Neonatology.  The placenta was then delivered manually and the uterus cleared of all debris.  The uterine incision was then closed with a running lock stitch of 0 monocryl.  An imbricating layer of 0 monocryl was closed as well. Excellent hemostasis of the uterine incision was achieved and the abdomen was cleared with irrigation.  The peritoneum was closed with a running stitch of 2-0 vicryl.  This incorporated the rectus muscles as a separate layer.  The fascia was then closed with a running stitch of 0 vicryl.  The subcutaneous layer was closed with interrupted  stitches of 2-0 plain gut.  The skin was closed with 4-0 vicryl on a Keith needle and steri-strips.  The patient tolerated the procedure well and was returned to the recovery room in stable condition.  All counts were correct times three.  Shaunie Boehm A   

## 2014-07-04 NOTE — Transfer of Care (Signed)
Immediate Anesthesia Transfer of Care Note  Patient: Patricia Nicholson  Procedure(s) Performed: Procedure(s): CESAREAN SECTION (N/A)  Patient Location: PACU  Anesthesia Type:Epidural  Level of Consciousness: awake, alert , oriented and patient cooperative  Airway & Oxygen Therapy: Patient Spontanous Breathing  Post-op Assessment: Report given to RN and Post -op Vital signs reviewed and stable  Post vital signs: Reviewed and stable  Last Vitals:  Filed Vitals:   07/04/14 2056  BP: 137/110  Pulse: 242  Temp:   Resp:     Complications: No apparent anesthesia complications

## 2014-07-04 NOTE — Progress Notes (Signed)
Pt comfortable o/n.  FHTs 120s, gSTV, NST R Toco q2-3 SVE is ONLY FT/50/high US bedside VTX  Will start pitocin.

## 2014-07-04 NOTE — Progress Notes (Signed)
Pt comfortable with epidural.  Pt has had several episodes of either late decels or moderate to severe variables evert time pitocin is turned back on.  Currently:  FHTs 120s, gSTV some accels, late decels and now moderate variables in late presentation  Toco- IUPC in place, q3 SVE 2/70/-2  Pt SROM at 3 pm with clear fluid.  D/W pt nonreassuring FHTs remote from vaginal delivery; pt agrees to proceed with LTCS- all R/B/Alt d/w pt.

## 2014-07-04 NOTE — Progress Notes (Signed)
Called earlier for a few late decels that persisted.  Pit turned off.  Now 130s, GSTV, NST R- no decels. Toco q 4-5 SROM.  Continue inductions- pit now on 6.

## 2014-07-05 ENCOUNTER — Encounter (HOSPITAL_COMMUNITY): Payer: Self-pay | Admitting: Obstetrics and Gynecology

## 2014-07-05 DIAGNOSIS — Z9889 Other specified postprocedural states: Secondary | ICD-10-CM

## 2014-07-05 LAB — CBC
HCT: 28.8 % — ABNORMAL LOW (ref 36.0–46.0)
Hemoglobin: 9.7 g/dL — ABNORMAL LOW (ref 12.0–15.0)
MCH: 30.8 pg (ref 26.0–34.0)
MCHC: 33.7 g/dL (ref 30.0–36.0)
MCV: 91.4 fL (ref 78.0–100.0)
PLATELETS: 212 10*3/uL (ref 150–400)
RBC: 3.15 MIL/uL — AB (ref 3.87–5.11)
RDW: 15.1 % (ref 11.5–15.5)
WBC: 12.7 10*3/uL — AB (ref 4.0–10.5)

## 2014-07-05 LAB — ABO/RH: ABO/RH(D): O POS

## 2014-07-05 MED ORDER — TETANUS-DIPHTH-ACELL PERTUSSIS 5-2.5-18.5 LF-MCG/0.5 IM SUSP
0.5000 mL | Freq: Once | INTRAMUSCULAR | Status: AC
  Administered 2014-07-05: 0.5 mL via INTRAMUSCULAR

## 2014-07-05 MED ORDER — LACTATED RINGERS IV BOLUS (SEPSIS)
500.0000 mL | Freq: Once | INTRAVENOUS | Status: AC
  Administered 2014-07-05: 500 mL via INTRAVENOUS

## 2014-07-05 MED ORDER — OXYCODONE-ACETAMINOPHEN 5-325 MG PO TABS: 2.0000 | ORAL_TABLET | ORAL | Status: DC | PRN

## 2014-07-05 MED ORDER — MENTHOL 3 MG MT LOZG
1.0000 | LOZENGE | OROMUCOSAL | Status: DC | PRN
Start: 2014-07-05 — End: 2014-07-06

## 2014-07-05 MED ORDER — SIMETHICONE 80 MG PO CHEW
80.0000 mg | CHEWABLE_TABLET | ORAL | Status: DC | PRN
  Administered 2014-07-05: 80 mg via ORAL
  Filled 2014-07-05: qty 1

## 2014-07-05 MED ORDER — DIPHENHYDRAMINE HCL 25 MG PO CAPS: 25.0000 mg | ORAL_CAPSULE | ORAL | Status: DC | PRN

## 2014-07-05 MED ORDER — SIMETHICONE 80 MG PO CHEW: 80.0000 mg | CHEWABLE_TABLET | ORAL | Status: DC

## 2014-07-05 MED ORDER — DIPHENHYDRAMINE HCL 25 MG PO CAPS: 25.0000 mg | ORAL_CAPSULE | Freq: Four times a day (QID) | ORAL | Status: DC | PRN

## 2014-07-05 MED ORDER — ONDANSETRON HCL 4 MG/2ML IJ SOLN: 4.0000 mg | Freq: Three times a day (TID) | INTRAMUSCULAR | Status: DC | PRN

## 2014-07-05 MED ORDER — METHYLERGONOVINE MALEATE 0.2 MG/ML IJ SOLN: 0.2000 mg | INTRAMUSCULAR | Status: DC | PRN

## 2014-07-05 MED ORDER — LANOLIN HYDROUS EX OINT
1.0000 | TOPICAL_OINTMENT | CUTANEOUS | Status: DC | PRN
Start: 2014-07-05 — End: 2014-07-06

## 2014-07-05 MED ORDER — IBUPROFEN 600 MG PO TABS
600.0000 mg | ORAL_TABLET | Freq: Four times a day (QID) | ORAL | Status: DC
  Administered 2014-07-05 – 2014-07-06 (×6): 600 mg via ORAL
  Filled 2014-07-05 (×6): qty 1

## 2014-07-05 MED ORDER — SODIUM CHLORIDE 0.9 % IJ SOLN: 3.0000 mL | INTRAMUSCULAR | Status: DC | PRN

## 2014-07-05 MED ORDER — ACETAMINOPHEN 325 MG PO TABS: 650.0000 mg | ORAL_TABLET | ORAL | Status: DC | PRN

## 2014-07-05 MED ORDER — LEVOTHYROXINE SODIUM 50 MCG PO TABS
50.0000 ug | ORAL_TABLET | Freq: Every day | ORAL | Status: DC
  Administered 2014-07-05 – 2014-07-06 (×2): 50 ug via ORAL
  Filled 2014-07-05 (×3): qty 1

## 2014-07-05 MED ORDER — WITCH HAZEL-GLYCERIN EX PADS: 1.0000 "application " | MEDICATED_PAD | CUTANEOUS | Status: DC | PRN

## 2014-07-05 MED ORDER — ZOLPIDEM TARTRATE 5 MG PO TABS: 5.0000 mg | ORAL_TABLET | Freq: Every evening | ORAL | Status: DC | PRN

## 2014-07-05 MED ORDER — MEASLES, MUMPS & RUBELLA VAC ~~LOC~~ INJ
0.5000 mL | INJECTION | Freq: Once | SUBCUTANEOUS | Status: DC
  Filled 2014-07-05: qty 0.5

## 2014-07-05 MED ORDER — FLEET ENEMA 7-19 GM/118ML RE ENEM: 1.0000 | ENEMA | Freq: Every day | RECTAL | Status: DC | PRN

## 2014-07-05 MED ORDER — NALOXONE HCL 0.4 MG/ML IJ SOLN: 0.4000 mg | INTRAMUSCULAR | Status: DC | PRN

## 2014-07-05 MED ORDER — OXYCODONE-ACETAMINOPHEN 5-325 MG PO TABS: 1.0000 | ORAL_TABLET | ORAL | Status: DC | PRN

## 2014-07-05 MED ORDER — LACTATED RINGERS IV SOLN: INTRAVENOUS | Status: DC

## 2014-07-05 MED ORDER — METHYLERGONOVINE MALEATE 0.2 MG PO TABS: 0.2000 mg | ORAL_TABLET | ORAL | Status: DC | PRN

## 2014-07-05 MED ORDER — DIPHENHYDRAMINE HCL 50 MG/ML IJ SOLN: 12.5000 mg | INTRAMUSCULAR | Status: DC | PRN

## 2014-07-05 MED ORDER — SENNOSIDES-DOCUSATE SODIUM 8.6-50 MG PO TABS
2.0000 | ORAL_TABLET | ORAL | Status: DC
  Administered 2014-07-05: 2 via ORAL
  Filled 2014-07-05: qty 2

## 2014-07-05 MED ORDER — NALBUPHINE HCL 10 MG/ML IJ SOLN: 5.0000 mg | INTRAMUSCULAR | Status: DC | PRN

## 2014-07-05 MED ORDER — VALACYCLOVIR HCL 500 MG PO TABS
500.0000 mg | ORAL_TABLET | Freq: Two times a day (BID) | ORAL | Status: DC
Start: 2014-07-05 — End: 2014-07-06
  Filled 2014-07-05: qty 1

## 2014-07-05 MED ORDER — BISACODYL 10 MG RE SUPP: 10.0000 mg | Freq: Every day | RECTAL | Status: DC | PRN

## 2014-07-05 MED ORDER — DIBUCAINE 1 % RE OINT: 1.0000 "application " | TOPICAL_OINTMENT | RECTAL | Status: DC | PRN

## 2014-07-05 MED ORDER — SCOPOLAMINE 1 MG/3DAYS TD PT72
1.0000 | MEDICATED_PATCH | Freq: Once | TRANSDERMAL | Status: DC
  Filled 2014-07-05: qty 1

## 2014-07-05 MED ORDER — OXYTOCIN 40 UNITS IN LACTATED RINGERS INFUSION - SIMPLE MED: 62.5000 mL/h | INTRAVENOUS | Status: AC

## 2014-07-05 MED ORDER — PRENATAL MULTIVITAMIN CH
1.0000 | ORAL_TABLET | Freq: Every day | ORAL | Status: DC
  Administered 2014-07-05 – 2014-07-06 (×2): 1 via ORAL
  Filled 2014-07-05 (×2): qty 1

## 2014-07-05 MED ORDER — SIMETHICONE 80 MG PO CHEW
80.0000 mg | CHEWABLE_TABLET | Freq: Three times a day (TID) | ORAL | Status: DC
  Administered 2014-07-05 – 2014-07-06 (×5): 80 mg via ORAL
  Filled 2014-07-05 (×5): qty 1

## 2014-07-05 MED ORDER — NALOXONE HCL 1 MG/ML IJ SOLN
1.0000 ug/kg/h | INTRAVENOUS | Status: DC | PRN
  Filled 2014-07-05: qty 2

## 2014-07-05 MED ORDER — FERROUS SULFATE 325 (65 FE) MG PO TABS
325.0000 mg | ORAL_TABLET | Freq: Two times a day (BID) | ORAL | Status: DC
  Administered 2014-07-05 – 2014-07-06 (×2): 325 mg via ORAL
  Filled 2014-07-05 (×2): qty 1

## 2014-07-05 NOTE — Progress Notes (Signed)
Dr. Henderson CloudHorvath contacted related to patient's output less than 30cc per hour for the last eight hour shift. Orders received for bolus.

## 2014-07-05 NOTE — Anesthesia Postprocedure Evaluation (Signed)
  Anesthesia Post-op Note  Patient: Patricia Nicholson  Procedure(s) Performed: Procedure(s) (LRB): CESAREAN SECTION (N/A)  Patient Location: PACU  Anesthesia Type: Epidural  Level of Consciousness: awake and alert   Airway and Oxygen Therapy: Patient Spontanous Breathing  Post-op Pain: mild  Post-op Assessment: Post-op Vital signs reviewed, Patient's Cardiovascular Status Stable, Respiratory Function Stable, Patent Airway and No signs of Nausea or vomiting  Last Vitals:  Filed Vitals:   07/05/14 0235  BP: 141/66  Pulse: 63  Temp: 36.8 C  Resp: 20    Post-op Vital Signs: stable   Complications: No apparent anesthesia complications

## 2014-07-05 NOTE — Anesthesia Postprocedure Evaluation (Signed)
  Anesthesia Post-op Note  Patient: Georgiann Cockerlizabeth Dandridge  Procedure(s) Performed: * No procedures listed *  Patient Location: PACU and Mother/Baby  Anesthesia Type:Epidural  Level of Consciousness: awake, alert  and oriented  Airway and Oxygen Therapy: Patient Spontanous Breathing  Post-op Pain: none  Post-op Assessment: Post-op Vital signs reviewed and Patient's Cardiovascular Status Stable  Post-op Vital Signs: Reviewed and stable  Last Vitals:  Filed Vitals:   07/05/14 0610  BP: 128/69  Pulse: 73  Temp:   Resp:     Complications: No apparent anesthesia complications

## 2014-07-05 NOTE — Addendum Note (Signed)
Addendum  created 07/05/14 1151 by Leilani AbleFranklin Andria Head, MD   Modules edited: Anesthesia Review and Sign Navigator Section

## 2014-07-05 NOTE — Progress Notes (Signed)
Patient is eating, ambulating, voiding.  Pain control is good.  Appropriate lochia.  No complaints.  Feeling quite well, was a little dizzy when sat on side of bed earlier, anxiously awaiting the arrival of breakfast.  Filed Vitals:   07/05/14 0559 07/05/14 0602 07/05/14 0610 07/05/14 0830  BP: 119/81 139/82 128/69 132/66  Pulse: 88 76 73 67  Temp:    98.1 F (36.7 C)  TempSrc:    Oral  Resp:      Height:      Weight:      SpO2:  96% 96%     Fundus firm Perineum without swelling. Inc: c/d/i Ext: no CT  Lab Results  Component Value Date   WBC 12.7* 07/05/2014   HGB 9.7* 07/05/2014   HCT 28.8* 07/05/2014   MCV 91.4 07/05/2014   PLT 212 07/05/2014    --/--/O POS, O POS (05/03 2053)  A/P Post op day #1 s/p c/s for NRFHT. Decreased UOP earlier this morning.  Dr. Henderson CloudHorvath ordered fluid bolus.  I spoke with RN, she has not yet input current UOP but states it has improved and she will call if decrease noted again.  Routine care.  Expect d/c 5/5.    Philip AspenALLAHAN, Meygan Kyser

## 2014-07-05 NOTE — Lactation Note (Addendum)
This note was copied from the chart of Patricia Georgiann Cockerlizabeth Koppen. Lactation Consultation Note  Patient Name: Patricia Nicholson ZOXWR'UToday's Date: 07/05/2014 Reason for consult: Initial assessment Baby 14 hours of life. Mom just finished using DEBP and has drops of colostrum. Demonstrated to MOB and MGma how to use glove and give drops to baby. Reviewed cleaning instructions of pump parts. Enc mom to put baby to breasts with cues, then give baby any EBM obtained from pumping. Enc mom to pump after each feeding for 15 minutes. Enc mom to hand express after pumping. Mom given small bottles, curve-tipped syring, a measuring cup, and additional spoons for feeding EBM back to baby. Mom states that CliftonBetsy, bedside RN, assisted with hand expression and spoon-feeding, and mom states that she feels comfortable with this process.    Mom states that baby opens her mouth wide to latch, but is not able to maintain a latch at this time. Discussed with mom to keep trying to latch baby as she states her nipples are everting more with using the pump. Mom has large breasts that are not easily compressed. Did discuss with mom that if baby continues to have trouble latching, she can be fitted with a NS when she has more flow of EBM. Enc mom to offer lots of STS and nurse with cues.   Mom hypothyroid, discussed need to keep check on thyroid levels as can impact breastfeeding/milk supply.  Mom given [facility] broch[facility], aware of OP/BFSG, community resource, and [facility]. Enc mom to call for assistance as needed.   Maternal Data Has patient been taught Hand Expression?: Yes Does the patient have breastfeeding experience prior to this delivery?: No  Feeding    LATCH Score/Interventions                      Lactation Tools Discussed/Used Pump Review: Setup, frequency, and cleaning;Milk Storage Initiated by:: Bedside RN, [employer] Co[employer] & GoldBetsy Mckinnon. Date initiated:: 07/05/14   Consult Status Consult Status:  Follow-up Date: 07/06/14 Follow-up type: In-patient    Patricia Nicholson, Patricia Nicholson 07/05/2014, 12:00 PM

## 2014-07-06 LAB — BIRTH TISSUE RECOVERY COLLECTION (PLACENTA DONATION)

## 2014-07-06 MED ORDER — OXYCODONE-ACETAMINOPHEN 5-325 MG PO TABS: 1.0000 | ORAL_TABLET | ORAL | Status: AC | PRN

## 2014-07-06 MED ORDER — IBUPROFEN 600 MG PO TABS: 600.0000 mg | ORAL_TABLET | Freq: Four times a day (QID) | ORAL | Status: AC | PRN

## 2014-07-06 NOTE — Discharge Summary (Signed)
Obstetric Discharge Summary Reason for Admission: induction of labor Prenatal Procedures: NST and ultrasound Intrapartum Procedures: cesarean: low cervical, transverse Postpartum Procedures: none Complications-Operative and Postpartum: none HEMOGLOBIN  Date Value Ref Range Status  07/05/2014 9.7* 12.0 - 15.0 g/dL Final   HCT  Date Value Ref Range Status  07/05/2014 28.8* 36.0 - 46.0 % Final    Physical Exam:  General: alert, cooperative and no distress Lochia: appropriate Uterine Fundus: firm Incision: healing well, no significant drainage, no dehiscence, no significant erythema DVT Evaluation: No evidence of DVT seen on physical exam. Negative Homan's sign. No cords or calf tenderness.  Discharge Diagnoses: Term Pregnancy-delivered  Discharge Information: Date: 07/06/2014 Activity: pelvic rest Diet: routine Medications: Ibuprofen and Percocet Condition: stable Instructions: refer to practice specific booklet Discharge to: home   Newborn Data: Live born female  Birth Weight: 8 lb 0.2 oz (3634 g) APGAR: 9, 9  Home with mother.  Essie HartINN, Ayeza Therriault STACIA 07/06/2014, 8:51 AM

## 2014-07-06 NOTE — Progress Notes (Signed)
Subjective: Postpartum Day 2: Cesarean Delivery Patient reports incisional pain, tolerating PO, + flatus, + BM and no problems voiding.    Objective: Vital signs in last 24 hours: Temp:  [98.1 F (36.7 C)-98.2 F (36.8 C)] 98.1 F (36.7 C) (05/05 0629) Pulse Rate:  [62-72] 72 (05/05 0629) Resp:  [18-20] 18 (05/05 0629) BP: (111-133)/(60-71) 115/61 mmHg (05/05 0629) SpO2:  [98 %] 98 % (05/04 2119)  Physical Exam:  General: alert, cooperative and no distress Lochia: appropriate Uterine Fundus: firm Incision: healing well, no significant drainage, no dehiscence, no significant erythema DVT Evaluation: No evidence of DVT seen on physical exam. Negative Homan's sign. No cords or calf tenderness.   Recent Labs  07/03/14 2017 07/05/14 0553  HGB 11.1* 9.7*  HCT 33.3* 28.8*    Assessment/Plan: Status post Cesarean section. Doing well postoperatively.  Discharge home with standard precautions and return to clinic in 4-6 weeks.  Patricia Nicholson, Patricia Nicholson 07/06/2014, 8:48 AM

## 2014-07-06 NOTE — Lactation Note (Signed)
This note was copied from the chart of Patricia Nicholson. Lactation Consultation Note  Patient Name: Patricia Nicholson's Date: 07/06/2014 Reason for consult: Follow-up assessment Baby 39 hours of life. Mom states that she has been pumping, and is now using #16 NS when baby nursing. Mom has just finished hand expressing 3-4 mls of EBM. Assisted mom to latch baby in football position to right breast. Mom's breasts have less edema and are more compressible today. Mom able to apply #16 NS herself, and baby latched deeply suckling rhythmically with a few swallows noted. After nursing for about 5-10 minutes, did not see colostrum in NS, but mom's nipple everted more and shield appeared too small. Refitted mom with #20 NS and baby nurse for another 10 minutes with swallows and colostrum noted in NS. FOB placed EBM into shield with curve-tipped syringe and baby nursed long enough to remove most of EBM from shield. Parents given supplementation guidelines and discussed need to see mom's EBM amount increasing closer to supplementation amounts, with the option of supplementing with formula as needed later today based on mom's EBM amounts. Enc parents to ask for assistance with supplementation if EBM amount not increasing throughout the today.   Plan is for mom to nurse baby first with #20 NS, then check for colostrum/EBM in shield, then supplement with EBM and nurse as long as baby actively suckling at breast as baby is able to transfer milk through the shield. Enc mom to post-pump and use EBM at next feeding. Enc mom to nurse with cues, and to offer lots of STS, and wake to offer STS and if baby not cueing every 2-3 hours. Discussed assessment and interventions with patient's RN, Patricia Nicholson.  Mom aware of methods to transition baby away from NS, especially when milk comes in. Mom also aware of OP/BFSG and LC phone line assistance after D/C.   Maternal Data    Feeding Feeding Type: Breast Fed Length of feed:  10 min  LATCH Score/Interventions Latch: Grasps breast easily, tongue down, lips flanged, rhythmical sucking. Intervention(s): Adjust position;Assist with latch  Audible Swallowing: A few with stimulation Intervention(s): Skin to skin;Hand expression  Type of Nipple: Flat Intervention(s): Double electric pump;Shells;Reverse pressure  Comfort (Breast/Nipple): Soft / non-tender     Hold (Positioning): Assistance needed to correctly position infant at breast and maintain latch. Intervention(s): Support Pillows  LATCH Score: 7  Lactation Tools Discussed/Used Tools: Nipple Dorris CarnesShields;Shells;Pump;Other (comment) (syringe.) Nipple shield size: 16 Shell Type: Inverted Breast pump type: Double-Electric Breast Pump   Consult Status Consult Status: Follow-up Date: 07/07/14 Follow-up type: In-patient    Patricia Nicholson 07/06/2014, 12:33 PM

## 2014-07-06 NOTE — Lactation Note (Signed)
This note was copied from the chart of Patricia Georgiann Cockerlizabeth Karp. Lactation Consultation Note Mom having difficulty latching baby. Mom has flat nipples w/large breast. Edema noted posterior of breast bilaterally, more so to Lt. Breast and nipple. Reverse pressure to nipple to soften. Hand expression taught and done, obtained 5 ml.  Fitted #16 NS, inserted colostrum inside NS and baby latched w/stimulation. Baby latched well w/chin tug for deeper latch.  Teaching on latching and expressing colostrum, applying NS, shallow latch verses deep latch.  Encouraged mom to wear supportive bra and elevate breast to help eliminate edema to breast. Patient Name: Patricia Nicholson Today's Date: 07/06/2014 Reason for consult: Follow-up assessment;Difficult latch   Maternal Data    Feeding Feeding Type: Breast Milk Length of feed: 10 min (still BF)  LATCH Score/Interventions Latch: Repeated attempts needed to sustain latch, nipple held in mouth throughout feeding, stimulation needed to elicit sucking reflex. Intervention(s): Adjust position;Assist with latch;Breast massage;Breast compression  Audible Swallowing: A few with stimulation Intervention(s): Skin to skin;Hand expression Intervention(s): Alternate breast massage  Type of Nipple: Flat Intervention(s): Shells;Double electric pump  Comfort (Breast/Nipple): Filling, red/small blisters or bruises, mild/mod discomfort (edema)     Hold (Positioning): Assistance needed to correctly position infant at breast and maintain latch. Intervention(s): Support Pillows;Position options;Skin to skin  LATCH Score: 5  Lactation Tools Discussed/Used Tools: Shells;Nipple Dorris CarnesShields;Pump Nipple shield size: 16 Shell Type: Inverted Breast pump type: Double-Electric Breast Pump Pump Review: Setup, frequency, and cleaning;Milk Storage   Consult Status Consult Status: Follow-up Date: 07/07/14 Follow-up type: In-patient    Patricia Nicholson, Patricia Nicholson 07/06/2014, 3:56  AM

## 2014-07-07 ENCOUNTER — Ambulatory Visit: Payer: Self-pay

## 2014-07-07 NOTE — Lactation Note (Signed)
This note was copied from the chart of Patricia Nicholson. Lactation Consultation Note  Patient Name: Patricia Nicholson [id number]'XToday's Date: 07/07/2014 Reason for consult: Follow-up assessment Baby 60 hours of life. Mom states that her colostrum has increased with each pumping, but mom states that she has decided that she wants to continue pumping and bottle-feed EBM to baby. Mom has personal pump at home. Discussed engorgement prevention/treatment. Enc mom to massage, pump at least every 3 hours for 15 minutes/every time baby feeds, and then hand express. Referred parents to EBM storage guidelines and number of diapers to expect by day of life. Parents have supplementation guidelines and enc to feed with cues. Parents states that they were told to make follow-up appointment for Monday, 07-10-14. Parents aware of OP/BFSG and LC phone line assistance after D/C. Discussed pumping and returning to work with mom.   Maternal Data    Feeding Feeding Type: Breast Milk  LATCH Score/Interventions                      Lactation Tools Discussed/Used     Consult Status Consult Status: Complete    Nancy NordmannWILLIARD, Maytal Mijangos 07/07/2014, 9:34 AM

## 2014-07-19 ENCOUNTER — Encounter (HOSPITAL_COMMUNITY): Payer: Self-pay | Admitting: Obstetrics and Gynecology

## 2014-07-19 NOTE — Addendum Note (Signed)
Addendum  created 07/19/14 1333 by Karie SchwalbeMary Ellyse Rotolo, MD   Modules edited: Anesthesia Events, Anesthesia Responsible Staff

## 2014-07-24 NOTE — Addendum Note (Signed)
Addendum  created 07/24/14 0750 by Karie SchwalbeMary Zania Kalisz, MD   Modules edited: Anesthesia Events

## 2014-10-06 ENCOUNTER — Other Ambulatory Visit: Payer: Self-pay | Admitting: Obstetrics and Gynecology

## 2014-10-09 LAB — CYTOLOGY - PAP

## 2015-09-20 ENCOUNTER — Other Ambulatory Visit (HOSPITAL_COMMUNITY): Payer: Self-pay | Admitting: Orthopedic Surgery

## 2015-09-20 DIAGNOSIS — M79662 Pain in left lower leg: Secondary | ICD-10-CM

## 2015-09-20 DIAGNOSIS — M7989 Other specified soft tissue disorders: Principal | ICD-10-CM

## 2015-09-21 ENCOUNTER — Ambulatory Visit (HOSPITAL_COMMUNITY)
Admission: RE | Admit: 2015-09-21 | Discharge: 2015-09-21 | Disposition: A | Discharge status: Home / Self Care | Payer: BC Managed Care – PPO | Source: Ambulatory Visit | Attending: Orthopedic Surgery | Admitting: Orthopedic Surgery

## 2015-09-21 DIAGNOSIS — M7989 Other specified soft tissue disorders: Secondary | ICD-10-CM

## 2015-09-21 DIAGNOSIS — M79662 Pain in left lower leg: Secondary | ICD-10-CM

## 2015-09-21 DIAGNOSIS — M79605 Pain in left leg: Secondary | ICD-10-CM | POA: Insufficient documentation

## 2015-09-21 NOTE — Progress Notes (Addendum)
VASCULAR LAB PRELIMINARY  PRELIMINARY  PRELIMINARY  PRELIMINARY  Left: DVT noted popliteal vein and peroneal vein.    Right : No evidence of  DVT.   Bilateral:No evidence of superficial thrombosis.  No Baker's cyst.  Called results to doctor's office spoke with Her doctor.    Jenetta Logesami Jamaris Theard, RVT, RDMS 09/21/2015, 2:49 PM

## 2015-10-26 ENCOUNTER — Other Ambulatory Visit: Payer: Self-pay | Admitting: Obstetrics and Gynecology

## 2015-10-29 LAB — CYTOLOGY - PAP

## 2016-02-07 ENCOUNTER — Other Ambulatory Visit: Payer: Self-pay | Admitting: Family Medicine

## 2016-02-07 ENCOUNTER — Ambulatory Visit
Admission: RE | Admit: 2016-02-07 | Discharge: 2016-02-07 | Disposition: A | Discharge status: Home / Self Care | Payer: BC Managed Care – PPO | Source: Ambulatory Visit | Attending: Family Medicine | Admitting: Family Medicine

## 2016-02-07 DIAGNOSIS — I82402 Acute embolism and thrombosis of unspecified deep veins of left lower extremity: Secondary | ICD-10-CM

## 2017-10-20 IMAGING — US US EXTREM LOW VENOUS*L*
1 series · 13 of 24 positions shown · non-contrast
Comparison: None.

CLINICAL DATA: Follow-up left lower extremity DVT. Evaluate for
acute or chronic DVT.



[Series 1: us extrem low venous*left* · 13 of 34 slices shown]
[im 1/34]
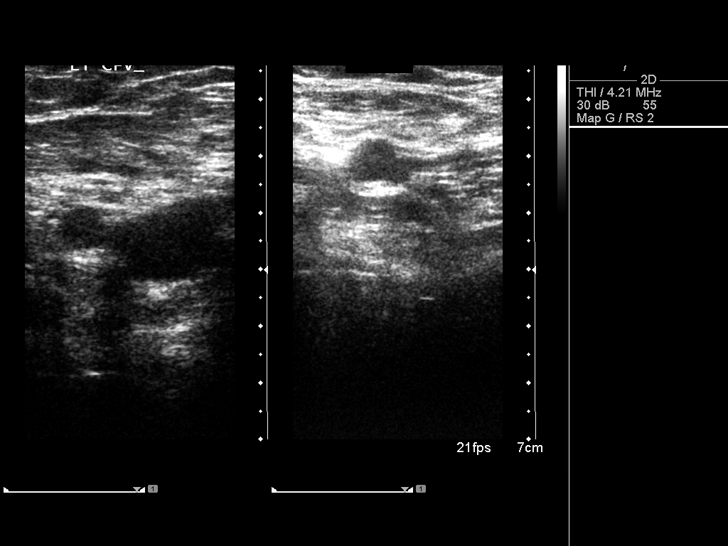
[im 3/34]
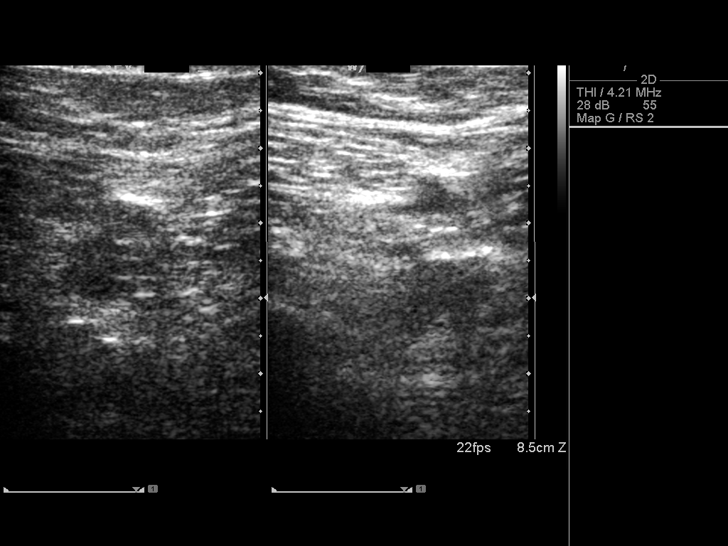
[im 6/34]
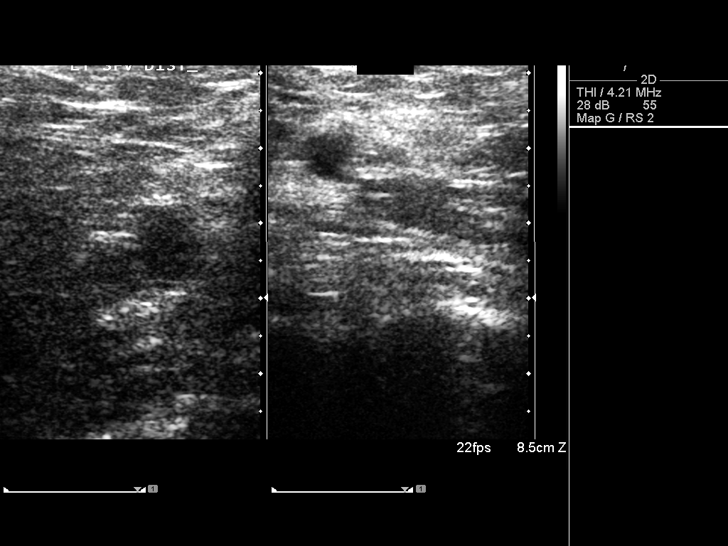
[im 9/34]
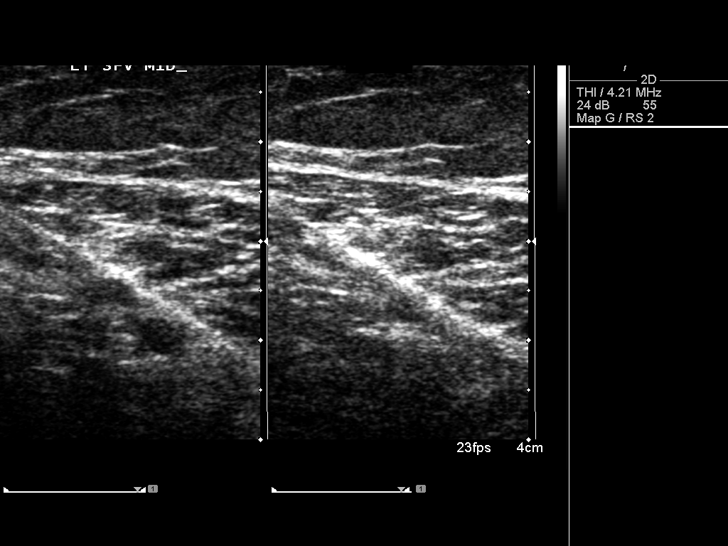
[im 12/34]
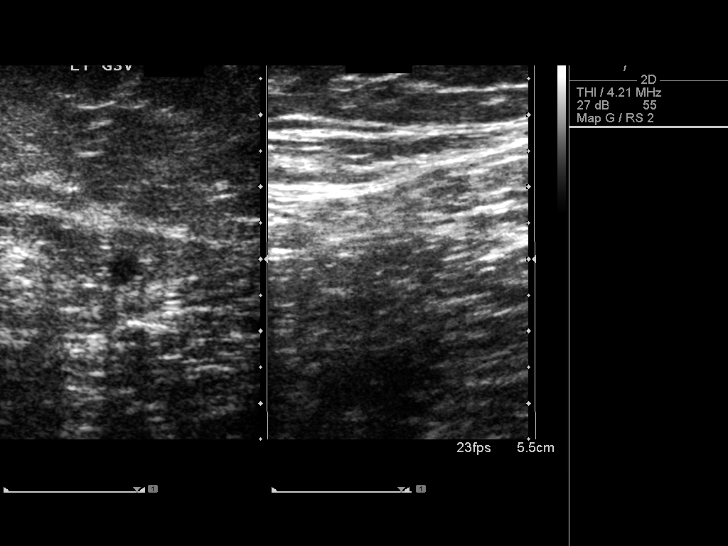
[im 15/34]
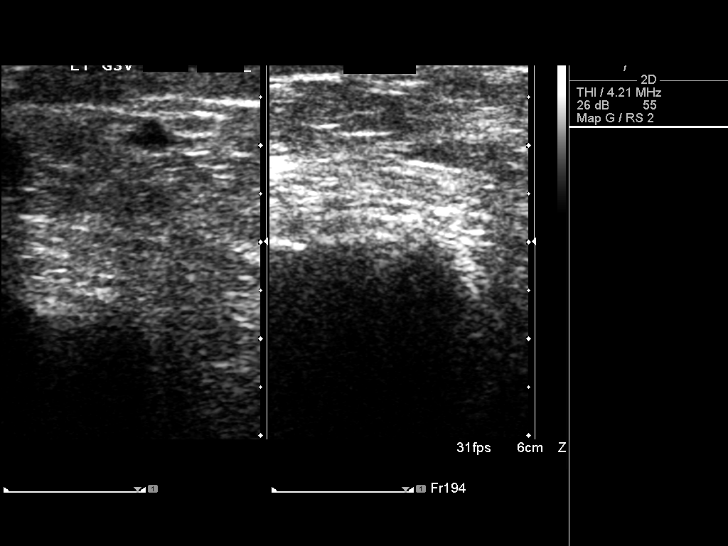
[im 18/34]
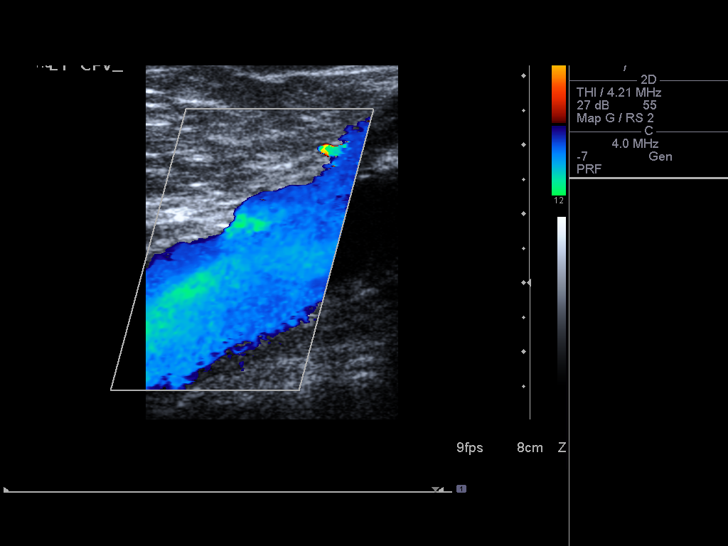
[im 19/34]
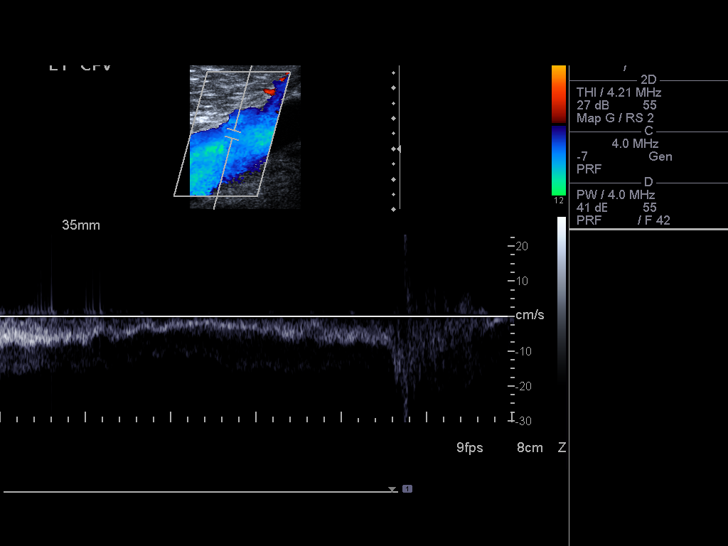
[im 22/34]
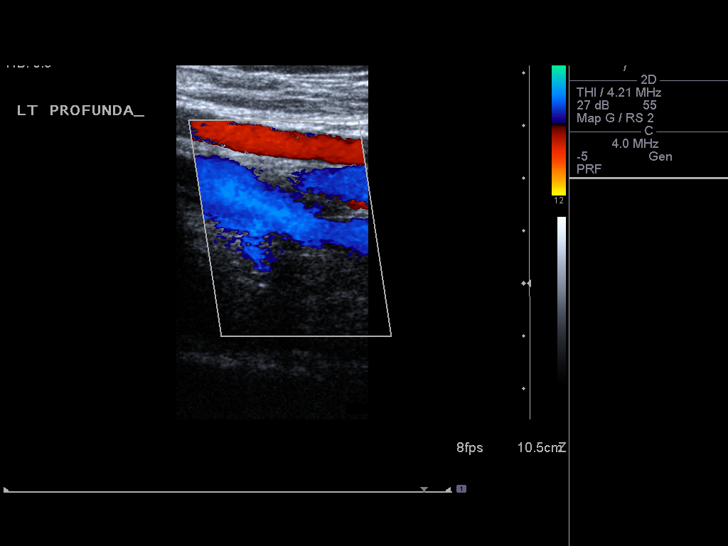
[im 25/34]
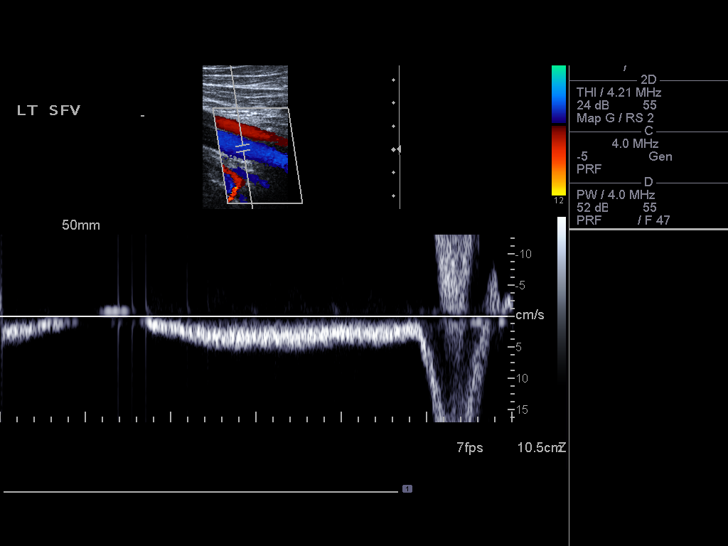
[im 28/34]
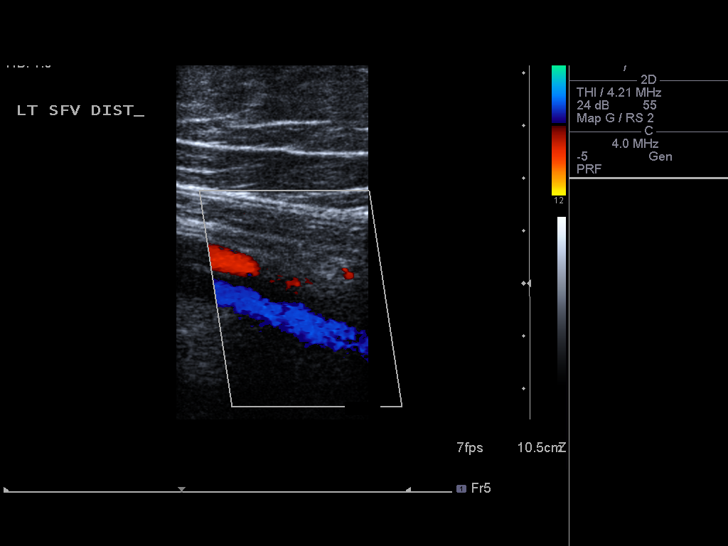
[im 31/34]
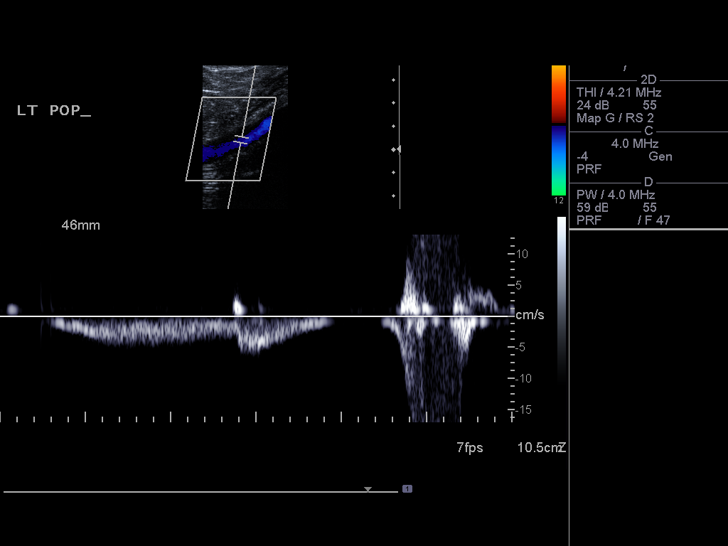
[im 34/34]
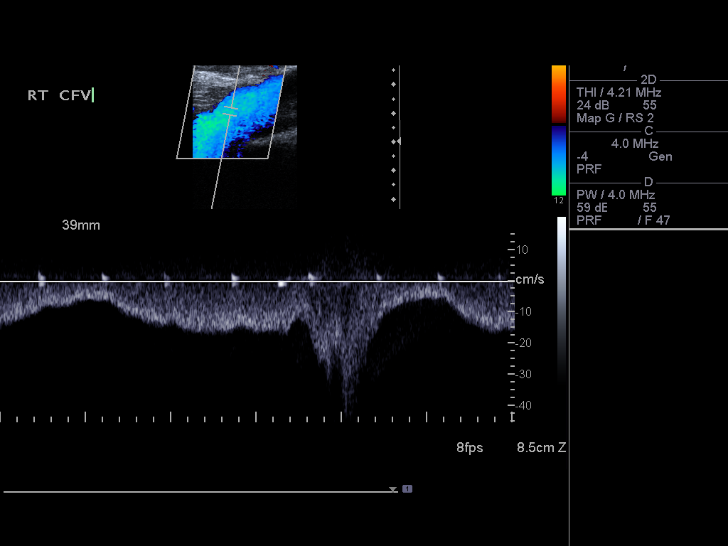

[13 of 24 positions shown; findings below may reference images not displayed]

FINDINGS: Contralateral Common Femoral Vein: Respiratory phasicity is normal
and symmetric with the symptomatic side. No evidence of thrombus.
Normal compressibility.

Common Femoral Vein: No evidence of thrombus. Normal
compressibility, respiratory phasicity and response to augmentation.

Saphenofemoral Junction: No evidence of thrombus. Normal
compressibility and flow on color Doppler imaging.

Profunda Femoral Vein: No evidence of thrombus. Normal
compressibility and flow on color Doppler imaging.

Femoral Vein: No evidence of thrombus. Normal compressibility,
respiratory phasicity and response to augmentation.

Popliteal Vein: No evidence of thrombus. Normal compressibility,
respiratory phasicity and response to augmentation.

Calf Veins: No evidence of thrombus. Normal compressibility and flow
on color Doppler imaging.

Superficial Great Saphenous Vein: No evidence of thrombus. Normal
compressibility and flow on color Doppler imaging.

Venous Reflux:  None.

Other Findings:  None.
IMPRESSION: No evidence of acute or chronic DVT within the left lower extremity.

## 2024-04-06 ENCOUNTER — Other Ambulatory Visit: Payer: Self-pay | Admitting: Obstetrics and Gynecology

## 2024-04-06 DIAGNOSIS — R928 Other abnormal and inconclusive findings on diagnostic imaging of breast: Secondary | ICD-10-CM
